# Patient Record
Sex: Female | Born: 1990 | Hispanic: Yes | Marital: Single | Smoking: Never smoker
Health system: Southern US, Community
[De-identification: ages and names within clinical notes are randomized; demographics above are authoritative.]

## PROBLEM LIST (undated history)

## (undated) DIAGNOSIS — Z789 Other specified health status: Secondary | ICD-10-CM

## (undated) HISTORY — DX: Other specified health status: Z78.9

## (undated) HISTORY — PX: CHOLECYSTECTOMY: SHX55

---

## 2014-01-27 ENCOUNTER — Ambulatory Visit (INDEPENDENT_AMBULATORY_CARE_PROVIDER_SITE_OTHER): Payer: 59 | Admitting: Emergency Medicine

## 2014-01-27 VITALS — BP 122/82 | HR 87 | Temp 98.0°F | Resp 17 | Ht 61.5 in | Wt 114.0 lb

## 2014-01-27 DIAGNOSIS — J209 Acute bronchitis, unspecified: Secondary | ICD-10-CM

## 2014-01-27 DIAGNOSIS — J0140 Acute pansinusitis, unspecified: Secondary | ICD-10-CM

## 2014-01-27 MED ORDER — HYDROCOD POLST-CHLORPHEN POLST 10-8 MG/5ML PO LQCR: 5.0000 mL | Freq: Two times a day (BID) | ORAL | Status: DC | PRN

## 2014-01-27 MED ORDER — AMOXICILLIN-POT CLAVULANATE 875-125 MG PO TABS: 1.0000 | ORAL_TABLET | Freq: Two times a day (BID) | ORAL | Status: DC

## 2014-01-27 MED ORDER — PSEUDOEPHEDRINE-GUAIFENESIN ER 60-600 MG PO TB12: 1.0000 | ORAL_TABLET | Freq: Two times a day (BID) | ORAL | Status: DC

## 2014-01-27 NOTE — Patient Instructions (Signed)
Bronquitis aguda °(Acute Bronchitis) °La bronquitis es una inflamación de las vías respiratorias que se extienden desde la tráquea hasta los pulmones (bronquios). La inflamación produce la formación de mucosidad. Esto produce tos, que es el síntoma más frecuente de la bronquitis.  °Cuando la bronquitis es aguda, generalmente comienza de manera súbita y desaparece luego de un par de semanas. El hábito de fumar, las alergias y el asma pueden empeorar la bronquitis. Los episodios repetidos de bronquitis pueden causar más problemas pulmonares.  °CAUSAS °La causa más frecuente de bronquitis aguda es el mismo virus que produce el resfrío. El virus puede propagarse de una persona a la otra (contagioso) a través de la tos y los estornudos, y al tocar objetos contaminados. °SIGNOS Y SÍNTOMAS  °· Tos. °· Fiebre. °· Tos con mucosidad. °· Dolores en el cuerpo. °· Congestión en el pecho. °· Escalofríos. °· Falta de aire. °· Dolor de garganta. °DIAGNÓSTICO  °La bronquitis aguda en general se diagnostica con un examen físico. El médico también le hará preguntas sobre su historia clínica. En algunos casos se indican otros estudios, como radiografías, para descartar otras enfermedades.  °TRATAMIENTO  °La bronquitis aguda generalmente desaparece en un par de semanas. Con frecuencia, no es necesario realizar un tratamiento. Los medicamentos se indican para aliviar la fiebre o la tos. Generalmente, no es necesario el uso de antibióticos, pero pueden recetarse en ciertas ocasiones. En algunos casos, se recomienda el uso de un inhalador para mejorar la falta de aire y controlar la tos. Un vaporizador de aire frío podrá ayudarlo a disolver las secreciones bronquiales y facilitar su eliminación.  °INSTRUCCIONES PARA EL CUIDADO EN EL HOGAR  °· Descanse lo suficiente. °· Beba líquidos en abundancia para mantener la orina de color claro o amarillo pálido (excepto que padezca una enfermedad que requiera la restricción de líquidos). El aumento  de líquidos puede ayudar a que las secreciones respiratorias (esputo) sean menos espesas y a reducir la congestión del pecho, y evitará la deshidratación. °· Tome los medicamentos solamente como se lo haya indicado el médico. °· Si le recetaron antibióticos, asegúrese de terminarlos, incluso si comienza a sentirse mejor. °· Evite fumar o aspirar el humo de otros fumadores. La exposición al humo del cigarrillo o a irritantes químicos hará que la bronquitis empeore. Si fuma, considere el uso de goma de mascar o la aplicación de parches en la piel que contengan nicotina para aliviar los síntomas de abstinencia. Si deja de fumar, sus pulmones se curarán más rápido. °· Reduzca la probabilidad de otro episodio de bronquitis aguda lavando sus manos con frecuencia, evitando a las personas que tengan síntomas y tratando de no tocarse las manos con la boca, la nariz o los ojos. °· Concurra a todas las visitas de control como se lo haya indicado el médico. °SOLICITE ATENCIÓN MÉDICA SI: °Los síntomas no mejoran después de una semana de tratamiento.  °SOLICITE ATENCIÓN MÉDICA DE INMEDIATO SI: °· Comienza a tener fiebre o escalofríos cada vez más intensos. °· Siente dolor en el pecho. °· Le falta el aire de manera preocupante. °· La flema tiene sangre. °· Se deshidrata. °· Se desmaya o siente que va a desmayarse de forma repetida. °· Tiene vómitos que se repiten. °· Tiene un dolor de cabeza intenso. °ASEGÚRESE DE QUE:  °· Comprende estas instrucciones. °· Controlará su afección. °· Recibirá ayuda de inmediato si no mejora o si empeora. °Document Released: 02/09/2005 Document Revised: 06/26/2013 °ExitCare® Patient Information ©2015 [employer]. This information is not intended to replace   advice given to you by your health care provider. Make sure you discuss any questions you have with your health care provider. ° °

## 2014-01-27 NOTE — Progress Notes (Signed)
[facility] [address], [place] [address] [phone]  Date:  01/27/2014   Name:  Waunita SchoonerZarely Valdez-Beltran   DOB:  05/28/1990   MRN:  [id number]  PCP:  No primary care provider on file.    Chief Complaint: Cough; Sore Throat; URI; Fatigue; and Allergies   History of Present Illness:  Waunita SchoonerZarely Valdez-Beltran is a 23 y.o. very pleasant female patient who presents with the following:  Patient is ill for past two days.  Has an overall feeling of malaise.  No fever or chills Has nasal congestion and a purulent post nasal drainage Sore throat. Cough productive of purulent sputum with no wheezing or shortness of breath. No nausea or vomiting.  No rash No improvement with over the counter medications or other home remedies.  Denies other complaint or health concern today.   There are no active problems to display for this patient.   No past medical history on file.  No past surgical history on file.  History  Substance Use Topics  . Smoking status: Never Smoker   . Smokeless tobacco: Not on file  . Alcohol Use: Not on file    No family history on file.  No Known Allergies  Medication list has been reviewed and updated.  No current outpatient prescriptions on file prior to visit.   No current facility-administered medications on file prior to visit.    Review of Systems:  As per HPI, otherwise negative.    Physical Examination: Filed Vitals:   01/27/14 1028  BP: 122/82  Pulse: 87  Temp: 98 F (36.7 C)  Resp: 17   Filed Vitals:   01/27/14 1028  Height: 5' 1.5" (1.562 m)  Weight: 114 lb (51.71 kg)   Body mass index is 21.19 kg/(m^2). Ideal Body Weight: Weight in (lb) to have BMI = 25: 134.2  GEN: WDWN, NAD, Non-toxic, A & O x 3 HEENT: Atraumatic, Normocephalic. Neck supple. No masses, No LAD. Ears and Nose: No external deformity. CV: RRR, No M/G/R. No JVD. No thrill. No extra heart sounds. PULM: CTA B, no wheezes, crackles,  rhonchi. No retractions. No resp. distress. No accessory muscle use. ABD: S, NT, ND, +BS. No rebound. No HSM. EXTR: No c/c/e NEURO Normal gait.  PSYCH: Normally interactive. Conversant. Not depressed or anxious appearing.  Calm demeanor.    Assessment and Plan: Sinusitis Bronchitis augmentin mucinex  tussionex   Signed,  Phillips OdorJeffery Koen Antilla, MD

## 2014-10-04 ENCOUNTER — Ambulatory Visit (INDEPENDENT_AMBULATORY_CARE_PROVIDER_SITE_OTHER): Payer: Self-pay | Admitting: Family Medicine

## 2014-10-04 VITALS — BP 104/64 | HR 74 | Temp 98.4°F | Resp 14 | Ht 61.5 in | Wt 115.0 lb

## 2014-10-04 DIAGNOSIS — R300 Dysuria: Secondary | ICD-10-CM

## 2014-10-04 LAB — POCT URINALYSIS DIPSTICK
Blood, UA: NEGATIVE
Glucose, UA: NEGATIVE
Ketones, UA: 40
NITRITE UA: NEGATIVE
PROTEIN UA: 30
Spec Grav, UA: 1.025
UROBILINOGEN UA: 2
pH, UA: 6

## 2014-10-04 LAB — POCT UA - MICROSCOPIC ONLY
CASTS, UR, LPF, POC: NEGATIVE
Crystals, Ur, HPF, POC: NEGATIVE
Yeast, UA: NEGATIVE

## 2014-10-04 MED ORDER — NITROFURANTOIN MONOHYD MACRO 100 MG PO CAPS: 100.0000 mg | ORAL_CAPSULE | Freq: Two times a day (BID) | ORAL | Status: DC

## 2014-10-04 NOTE — Progress Notes (Signed)
Subjective:    Patient ID: Rita Rojas, female    DOB: 05/09/1990, 24 y.o.   MRN: [id number]  10/04/2014  Pain w/ Urination and Abdominal Pain   HPI This 24 y.o. female presents for evaluation of dysuria, abdominal pain.  No fever but +chills two days ago; no sweats.  Chills at work for two days.  No n/v/d/c.  Normal b.m. Yesterday.  Mild dysuria.  No hematuria.  No frequency. +urgency.  +back pain but might be due to work; works at [employer].  +sexually active; four year relationship.  Diagnosed with yeast infection with female exam; last diagnosed with yeast infection was last visit.  No vaginal discharge.  Intermittent itching in vaginal area.  No STDs.  Total partners = 3.  Dates males only.  No contraception.  LMP last month; 09-22-2014.  Taking PNV.  Last pap smear not sure.     Review of Systems  Constitutional: Positive for chills. Negative for fever and diaphoresis.  Gastrointestinal: Positive for abdominal pain. Negative for nausea, vomiting, diarrhea and constipation.  Genitourinary: Positive for dysuria and urgency. Negative for frequency, hematuria, flank pain, decreased urine volume, vaginal bleeding, vaginal discharge, enuresis, difficulty urinating, genital sores, vaginal pain, menstrual problem and pelvic pain.    History reviewed. No pertinent past medical history. History reviewed. No pertinent past surgical history. No Known Allergies No current outpatient prescriptions on file.   No current facility-administered medications for this visit.   Social History   Social History  . Marital Status: Single    Spouse Name: N/A  . Number of Children: N/A  . Years of Education: N/A   Occupational History  . Not on file.   Social History Main Topics  . Smoking status: Never Smoker   . Smokeless tobacco: Not on file  . Alcohol Use: Not on file  . Drug Use: Not on file  . Sexual Activity: Yes    Birth Control/ Protection: None   Other  Topics Concern  . Not on file   Social History Narrative   Marital status: single      Children: none      Employment: Social research officer, government      Sexual activity: yes; no contraception in 2016; desires pregnancy.  Total partners = 3.  Males only.       Objective:    BP 104/64 mmHg  Pulse 74  Temp(Src) 98.4 F (36.9 C) (Oral)  Resp 14  Ht 5' 1.5" (1.562 m)  Wt 115 lb (52.164 kg)  BMI 21.38 kg/m2  SpO2 97% Physical Exam  Constitutional: She is oriented to person, place, and time. She appears well-developed and well-nourished. No distress.  HENT:  Head: Normocephalic and atraumatic.  Eyes: Conjunctivae are normal. Pupils are equal, round, and reactive to light.  Neck: Normal range of motion. Neck supple.  Cardiovascular: Normal rate, regular rhythm and normal heart sounds.  Exam reveals no gallop and no friction rub.   No murmur heard. Pulmonary/Chest: Effort normal and breath sounds normal. She has no wheezes. She has no rales.  Abdominal: Soft. Bowel sounds are normal. She exhibits no distension and no mass. There is tenderness in the suprapubic area. There is no rebound, no guarding and no CVA tenderness.  Neurological: She is alert and oriented to person, place, and time.  Skin: She is not diaphoretic.  Psychiatric: She has a normal mood and affect. Her behavior is normal.  Nursing note and vitals reviewed.  Results for orders placed or  performed in visit on 10/04/14  POCT urinalysis dipstick  Result Value Ref Range   Color, UA amber    Clarity, UA hazy    Glucose, UA neg    Bilirubin, UA small    Ketones, UA 40    Spec Grav, UA 1.025    Blood, UA neg    pH, UA 6.0    Protein, UA 30    Urobilinogen, UA 2.0    Nitrite, UA neg    Leukocytes, UA Trace (A) Negative  POCT UA - Microscopic Only  Result Value Ref Range   WBC, Ur, HPF, POC 8-12    RBC, urine, microscopic 1-3    Bacteria, U Microscopic 1+    Mucus, UA 2+    Epithelial cells, urine per micros  8-12    Crystals, Ur, HPF, POC neg    Casts, Ur, LPF, POC neg    Yeast, UA neg        Assessment & Plan:   1. Dysuria    -New. -Send urine culture. -Treat with Macrobid bid.   -RTC fever, vomiting, worsening symptoms. -Pt declined pelvic exam today; symptoms primarily urinary; no concern regarding STDs.   No orders of the defined types were placed in this encounter.    No Follow-up on file.    Omkar Stratmann Paulita Fujita, M.D. [facility] [address] [place], [place]  [address] [phone] phone [phone] fax

## 2014-10-04 NOTE — Patient Instructions (Signed)

## 2014-10-06 LAB — URINE CULTURE

## 2017-02-22 DIAGNOSIS — O021 Missed abortion: Secondary | ICD-10-CM | POA: Insufficient documentation

## 2017-06-03 ENCOUNTER — Encounter: Payer: Self-pay | Admitting: Family Medicine

## 2017-06-03 ENCOUNTER — Ambulatory Visit (INDEPENDENT_AMBULATORY_CARE_PROVIDER_SITE_OTHER): Payer: 59 | Admitting: Family Medicine

## 2017-06-03 DIAGNOSIS — Z3400 Encounter for supervision of normal first pregnancy, unspecified trimester: Secondary | ICD-10-CM

## 2017-06-03 DIAGNOSIS — Z113 Encounter for screening for infections with a predominantly sexual mode of transmission: Secondary | ICD-10-CM

## 2017-06-03 DIAGNOSIS — Z3687 Encounter for antenatal screening for uncertain dates: Secondary | ICD-10-CM

## 2017-06-03 DIAGNOSIS — Z3481 Encounter for supervision of other normal pregnancy, first trimester: Secondary | ICD-10-CM | POA: Diagnosis not present

## 2017-06-03 DIAGNOSIS — N915 Oligomenorrhea, unspecified: Secondary | ICD-10-CM

## 2017-06-03 MED ORDER — DOXYLAMINE-PYRIDOXINE 10-10 MG PO TBEC: 2.0000 | DELAYED_RELEASE_TABLET | Freq: Every day | ORAL | 5 refills | Status: DC

## 2017-06-03 MED ORDER — PRENATAL VITAMINS 0.8 MG PO TABS: 1.0000 | ORAL_TABLET | Freq: Every day | ORAL | 12 refills | Status: AC

## 2017-06-03 NOTE — Progress Notes (Signed)
DATING AND VIABILITY SONOGRAM   Rita Rojas is a 27 y.o. year old 23P0020 with LMP No LMP recorded (lmp unknown). Patient is pregnant. which would correlate to  612w5d weeks gestation.  She has regular menstrual cycles.   She is here today for a confirmatory initial sonogram.    GESTATION: SINGLETON     FETAL ACTIVITY:          Heart rate       175          The fetus is active.    ADNEXA: The ovaries are normal.   GESTATIONAL AGE AND  BIOMETRICS:  Gestational criteria: Estimated Date of Delivery: 01/01/18 by early ultrasound now at 292w5d  Previous Scans:1  GESTATIONAL SAC           4.27 cm       9-5 weeks  CROWN RUMP LENGTH           2.40 cm         9-4 weeks                                                                               AVERAGE EGA(BY THIS SCAN): 9-5weeks  WORKING EDD( early ultrasound ):  01-01-18    Armandina StammerJennifer Luree Palla 06/03/2017 9:49 AM

## 2017-06-03 NOTE — Progress Notes (Signed)
  Subjective:  Rita SchoonerZarely Valdez-Beltran is a G3P0020 7375w5d being seen today for her first obstetrical visit.  Her obstetrical history is significant for 2 prior SAB. Last SAB 01/2017. Has infreqent menses - has gone close to a year without menses. Was evaluated for this - progesterone challenge without bleeding afterwards. Patient does intend to breast feed. Pregnancy history fully reviewed.  Patient reports nausea.  BP 105/62   Pulse 67   Wt 130 lb (59 kg)   LMP  (LMP Unknown)   BMI 24.17 kg/m   HISTORY: OB History  Gravida Para Term Preterm AB Living  3       2    SAB TAB Ectopic Multiple Live Births  2            # Outcome Date GA Lbr Len/2nd Weight Sex Delivery Anes PTL Lv  3 Current           2 SAB           1 SAB             History reviewed. No pertinent past medical history.  Past Surgical History:  Procedure Laterality Date  . CHOLECYSTECTOMY      Family History  Problem Relation Age of Onset  . Diabetes Father      Exam    Uterus:     Pelvic Exam:    Perineum: No Hemorrhoids, Normal Perineum   Vulva: Bartholin's, Urethra, Skene's normal   Vagina:  normal mucosa   Cervix: no cervical motion tenderness, no lesions and nulliparous appearance   Adnexa: normal adnexa and no mass, fullness, tenderness   Bony Pelvis: gynecoid  System: Breast:  normal appearance, no masses or tenderness, Inspection negative, No nipple retraction or dimpling, No nipple discharge or bleeding, No axillary or supraclavicular adenopathy, Normal to palpation without dominant masses   Skin: normal coloration and turgor, no rashes    Neurologic: gait normal; reflexes normal and symmetric   Extremities: normal strength, tone, and muscle mass   HEENT PERRLA and extra ocular movement intact   Mouth/Teeth mucous membranes moist, pharynx normal without lesions   Neck supple and no masses   Cardiovascular: regular rate and rhythm, no murmurs or gallops   Respiratory:  appears well, vitals  normal, no respiratory distress, acyanotic, normal RR, ear and throat exam is normal, neck free of mass or lymphadenopathy, chest clear, no wheezing, crepitations, rhonchi, normal symmetric air entry   Abdomen: soft, non-tender; bowel sounds normal; no masses,  no organomegaly   Urinary: urethral meatus normal      Assessment:    Pregnancy: [id number] Patient Active Problem List   Diagnosis Date Noted  . Supervision of normal first pregnancy, antepartum 06/03/2017      Plan:   1. Supervision of normal first pregnancy, antepartum Diclegis for N/V Genetic Screening discussed Quad Screen: requested.  Ultrasound discussed; fetal survey: requested.  Follow up in 4 weeks.  - Obstetric Panel, Including HIV - Urine Culture - GC[facility]Chlamydia probe amp ([place] - Inherit[facility] Society Guided     Problem list reviewed and updated. 75% of 45 min visit spent on counseling and coordination of care.    Levie HeritageJacob J Itzy Adler 06/03/2017

## 2017-06-03 NOTE — Progress Notes (Signed)
Pt states last pap smear was a year ago-normal results.

## 2017-06-05 LAB — URINE CULTURE

## 2017-06-07 LAB — GC/CHLAMYDIA PROBE AMP (~~LOC~~) NOT AT ARMC
Chlamydia: NEGATIVE
Neisseria Gonorrhea: NEGATIVE

## 2017-06-14 LAB — OBSTETRIC PANEL, INCLUDING HIV
Antibody Screen: NEGATIVE
BASOS ABS: 0 10*3/uL (ref 0.0–0.2)
Basos: 0 %
EOS (ABSOLUTE): 0 10*3/uL (ref 0.0–0.4)
Eos: 0 %
HEP B S AG: NEGATIVE
HIV SCREEN 4TH GENERATION: NONREACTIVE
Hematocrit: 36.9 % (ref 34.0–46.6)
Hemoglobin: 12.7 g/dL (ref 11.1–15.9)
IMMATURE GRANS (ABS): 0 10*3/uL (ref 0.0–0.1)
IMMATURE GRANULOCYTES: 0 %
LYMPHS: 27 %
Lymphocytes Absolute: 2 10*3/uL (ref 0.7–3.1)
MCH: 29.6 pg (ref 26.6–33.0)
MCHC: 34.4 g/dL (ref 31.5–35.7)
MCV: 86 fL (ref 79–97)
MONOCYTES: 5 %
Monocytes Absolute: 0.4 10*3/uL (ref 0.1–0.9)
NEUTROS PCT: 68 %
Neutrophils Absolute: 5 10*3/uL (ref 1.4–7.0)
Platelets: 246 10*3/uL (ref 150–379)
RBC: 4.29 x10E6/uL (ref 3.77–5.28)
RDW: 13.2 % (ref 12.3–15.4)
RPR: NONREACTIVE
Rh Factor: POSITIVE
Rubella Antibodies, IGG: 2.81 index (ref >0.99)
WBC: 7.4 10*3/uL (ref 3.4–10.8)

## 2017-06-14 LAB — INHERITEST SOCIETY GUIDED

## 2017-07-05 ENCOUNTER — Encounter: Payer: Self-pay | Admitting: Obstetrics & Gynecology

## 2017-07-05 ENCOUNTER — Ambulatory Visit (INDEPENDENT_AMBULATORY_CARE_PROVIDER_SITE_OTHER): Payer: 59 | Admitting: Obstetrics & Gynecology

## 2017-07-05 VITALS — BP 121/64 | HR 81 | Wt 127.8 lb

## 2017-07-05 DIAGNOSIS — Z3400 Encounter for supervision of normal first pregnancy, unspecified trimester: Secondary | ICD-10-CM

## 2017-07-05 DIAGNOSIS — Z029 Encounter for administrative examinations, unspecified: Secondary | ICD-10-CM

## 2017-07-05 NOTE — Addendum Note (Signed)
Addended by: Anell Barr on: 07/05/2017 09:06 AM   Modules accepted: Orders

## 2017-07-05 NOTE — Patient Instructions (Signed)
Round Ligament Pain The round ligament is a cord of muscle and tissue that helps to support the uterus. It can become a source of pain during pregnancy if it becomes stretched or twisted as the baby grows. The pain usually begins in the second trimester of pregnancy, and it can come and go until the baby is delivered. It is not a serious problem, and it does not cause harm to the baby. Round ligament pain is usually a short, sharp, and pinching pain, but it can also be a dull, lingering, and aching pain. The pain is felt in the lower side of the abdomen or in the groin. It usually starts deep in the groin and moves up to the outside of the hip area. Pain can occur with:  A sudden change in position.  Rolling over in bed.  Coughing or sneezing.  Physical activity.  Follow these instructions at home: Watch your condition for any changes. Take these steps to help with your pain:  When the pain starts, relax. Then try: ? Sitting down. ? Flexing your knees up to your abdomen. ? Lying on your side with one pillow under your abdomen and another pillow between your legs. ? Sitting in a warm bath for 15-20 minutes or until the pain goes away.  Take over-the-counter and prescription medicines only as told by your health care provider.  Move slowly when you sit and stand.  Avoid long walks if they cause pain.  Stop or lessen your physical activities if they cause pain.  Contact a health care provider if:  Your pain does not go away with treatment.  You feel pain in your back that you did not have before.  Your medicine is not helping. Get help right away if:  You develop a fever or chills.  You develop uterine contractions.  You develop vaginal bleeding.  You develop nausea or vomiting.  You develop diarrhea.  You have pain when you urinate. This information is not intended to replace advice given to you by your health care provider. Make sure you discuss any questions you have  with your health care provider. Document Released: 11/19/2007 Document Revised: 07/18/2015 Document Reviewed: 04/18/2014 Elsevier Interactive Patient Education  2018 [place] for Pregnant Women While you are pregnant, your body will require additional nutrition to help support your growing baby. It is recommended that you consume:  150 additional calories each day during your first trimester.  300 additional calories each day during your second trimester.  300 additional calories each day during your third trimester.  Eating a healthy, well-balanced diet is very important for your health and for your baby's health. You also have a higher need for some vitamins and minerals, such as folic acid, calcium, iron, and vitamin D. What do I need to know about eating during pregnancy?  Do not try to lose weight or go on a diet during pregnancy.  Choose healthy, nutritious foods. Choose  of a sandwich with a glass of milk instead of a candy bar or a high-calorie sugar-sweetened beverage.  Limit your overall intake of foods that have "empty calories." These are foods that have little nutritional value, such as sweets, desserts, candies, sugar-sweetened beverages, and fried foods.  Eat a variety of foods, especially fruits and vegetables.  Take a prenatal vitamin to help meet the additional needs during pregnancy, specifically for folic acid, iron, calcium, and vitamin D.  Remember to stay active. Ask your health care provider for exercise recommendations  that are specific to you.  Practice good food safety and cleanliness, such as washing your hands before you eat and after you prepare raw meat. This helps to prevent foodborne illnesses, such as listeriosis, that can be very dangerous for your baby. Ask your health care provider for more information about listeriosis. What does 150 extra calories look like? Healthy options for an additional 150 calories each day could be any of  the following:  Plain low-fat yogurt (6-8 oz) with  cup of berries.  1 apple with 2 teaspoons of peanut butter.  Cut-up vegetables with  cup of hummus.  Low-fat chocolate milk (8 oz or 1 cup).  1 string cheese with 1 medium orange.   of a peanut butter and jelly sandwich on whole-wheat bread (1 tsp of peanut butter).  For 300 calories, you could eat two of those healthy options each day. What is a healthy amount of weight to gain? The recommended amount of weight for you to gain is based on your pre-pregnancy BMI. If your pre-pregnancy BMI was:  Less than 18 (underweight), you should gain 28-40 lb.  18-24.9 (normal), you should gain 25-35 lb.  25-29.9 (overweight), you should gain 15-25 lb.  Greater than 30 (obese), you should gain 11-20 lb.  What if I am having twins or multiples? Generally, pregnant women who will be having twins or multiples may need to increase their daily calories by 300-600 calories each day. The recommended range for total weight gain is 25-54 lb, depending on your pre-pregnancy BMI. Talk with your health care provider for specific guidance about additional nutritional needs, weight gain, and exercise during your pregnancy. What foods can I eat? Grains Any grains. Try to choose whole grains, such as whole-wheat bread, oatmeal, or brown rice. Vegetables Any vegetables. Try to eat a variety of colors and types of vegetables to get a full range of vitamins and minerals. Remember to wash your vegetables well before eating. Fruits Any fruits. Try to eat a variety of colors and types of fruit to get a full range of vitamins and minerals. Remember to wash your fruits well before eating. Meats and Other Protein Sources Lean meats, including chicken, [place], fish, and lean cuts of beef, veal, or pork. Make sure that all meats are cooked to "well done." Tofu. Tempeh. Beans. Eggs. Peanut butter and other nut butters. Seafood, such as shrimp, crab, and lobster. If  you choose fish, select types that are higher in omega-3 fatty acids, including salmon, herring, mussels, trout, sardines, and pollock. Make sure that all meats are cooked to food-safe temperatures. Dairy Pasteurized milk and milk alternatives. Pasteurized yogurt and pasteurized cheese. Cottage cheese. Sour cream. Beverages Water. Juices that contain 100% fruit juice or vegetable juice. Caffeine-free teas and decaffeinated coffee. Drinks that contain caffeine are okay to drink, but it is better to avoid caffeine. Keep your total caffeine intake to less than 200 mg each day (12 oz of coffee, tea, or soda) or as directed by your health care provider. Condiments Any pasteurized condiments. Sweets and Desserts Any sweets and desserts. Fats and Oils Any fats and oils. The items listed above may not be a complete list of recommended foods or beverages. Contact your dietitian for more options. What foods are not recommended? Vegetables Unpasteurized (raw) vegetable juices. Fruits Unpasteurized (raw) fruit juices. Meats and Other Protein Sources Cured meats that have nitrates, such as bacon, salami, and hotdogs. Luncheon meats, bologna, or other deli meats (unless they are reheated until they  are steaming hot). Refrigerated pate, meat spreads from a meat counter, smoked seafood that is found in the refrigerated section of a store. Raw fish, such as sushi or sashimi. High mercury content fish, such as tilefish, shark, swordfish, and king mackerel. Raw meats, such as tuna or beef tartare. Undercooked meats and poultry. Make sure that all meats are cooked to food-safe temperatures. Dairy Unpasteurized (raw) milk and any foods that have raw milk in them. Soft cheeses, such as feta, queso blanco, queso fresco, Brie, Camembert cheeses, blue-veined cheeses, and Panela cheese (unless it is made with pasteurized milk, which must be stated on the label). Beverages Alcohol. Sugar-sweetened beverages, such as  sodas, teas, or energy drinks. Condiments Homemade fermented foods and drinks, such as pickles, sauerkraut, or kombucha drinks. (Store-bought pasteurized versions of these are okay.) Other Salads that are made in the store, such as ham salad, chicken salad, egg salad, tuna salad, and seafood salad. The items listed above may not be a complete list of foods and beverages to avoid. Contact your dietitian for more information. This information is not intended to replace advice given to you by your health care provider. Make sure you discuss any questions you have with your health care provider. Document Released: 11/24/2013 Document Revised: 07/18/2015 Document Reviewed: 07/25/2013 Elsevier Interactive Patient Education  [employer].

## 2017-07-05 NOTE — Progress Notes (Signed)
   PRENATAL VISIT NOTE  Subjective:  Rita Rojas is a 27 y.o. G3P0020 at [redacted]w[redacted]d being seen today for ongoing prenatal care.  She is currently monitored for the following issues for this low-risk pregnancy and has Supervision of normal first pregnancy, antepartum and Oligomenorrhea on their problem list.  Patient reports occo lower pelvic pain wtih prolonged standing..  Contractions: Not present. Vag. Bleeding: None.  Movement: Absent. Denies leaking of fluid.   The following portions of the patient's history were reviewed and updated as appropriate: allergies, current medications, past family history, past medical history, past social history, past surgical history and problem list. Problem list updated.  Objective:   Vitals:   07/05/17 0825  BP: 121/64  Pulse: 81  Weight: 127 lb 12.8 oz (58 kg)    Fetal Status: Fetal Heart Rate (bpm): 156   Movement: Absent     General:  Alert, oriented and cooperative. Patient is in no acute distress.  Skin: Skin is warm and dry. No rash noted.   Cardiovascular: Normal heart rate noted  Respiratory: Normal respiratory effort, no problems with respiration noted  Abdomen: Soft, gravid, appropriate for gestational age.  Pain/Pressure: Present     Pelvic: Cervical exam deferred        Extremities: Normal range of motion.  Edema: Trace  Mental Status: Normal mood and affect. Normal behavior. Normal judgment and thought content.   Assessment and Plan:  Pregnancy: G3P0020 at [redacted]w[redacted]d  1. Supervision of normal first pregnancy, antepartum Anatomy scan ordered.  2. Round ligament pain   Preterm labor symptoms and general obstetric precautions including but not limited to vaginal bleeding, contractions, leaking of fluid and fetal movement were reviewed in detail with the patient. Please refer to After Visit Summary for other counseling recommendations.  Return in about 1 month (around 08/02/2017).  No future appointments.  Willodean Rosenthal, MD

## 2017-07-08 ENCOUNTER — Encounter: Payer: Self-pay | Admitting: *Deleted

## 2017-07-22 ENCOUNTER — Telehealth: Payer: Self-pay

## 2017-07-22 NOTE — Telephone Encounter (Signed)
Pt called the office stating that she started cramping on her side after intercourse. Per Dr. Adrian Blackwater, a little cramping is normal but pt should come in if cramping starts to feel like contractions. Pt voiced understanding.

## 2017-08-02 ENCOUNTER — Encounter: Payer: Self-pay | Admitting: Obstetrics & Gynecology

## 2017-08-02 ENCOUNTER — Ambulatory Visit (INDEPENDENT_AMBULATORY_CARE_PROVIDER_SITE_OTHER): Payer: 59 | Admitting: Obstetrics & Gynecology

## 2017-08-02 ENCOUNTER — Ambulatory Visit (HOSPITAL_COMMUNITY)
Admission: RE | Admit: 2017-08-02 | Discharge: 2017-08-02 | Disposition: A | Discharge status: Home / Self Care | Payer: 59 | Source: Ambulatory Visit | Attending: Obstetrics & Gynecology | Admitting: Obstetrics & Gynecology

## 2017-08-02 ENCOUNTER — Encounter (HOSPITAL_COMMUNITY): Payer: Self-pay

## 2017-08-02 VITALS — BP 110/65 | HR 72 | Wt 129.0 lb

## 2017-08-02 DIAGNOSIS — O4402 Placenta previa specified as without hemorrhage, second trimester: Secondary | ICD-10-CM | POA: Insufficient documentation

## 2017-08-02 DIAGNOSIS — Z3400 Encounter for supervision of normal first pregnancy, unspecified trimester: Secondary | ICD-10-CM

## 2017-08-02 DIAGNOSIS — O4690 Antepartum hemorrhage, unspecified, unspecified trimester: Secondary | ICD-10-CM | POA: Insufficient documentation

## 2017-08-02 DIAGNOSIS — Z3689 Encounter for other specified antenatal screening: Secondary | ICD-10-CM | POA: Insufficient documentation

## 2017-08-02 DIAGNOSIS — O4692 Antepartum hemorrhage, unspecified, second trimester: Secondary | ICD-10-CM | POA: Insufficient documentation

## 2017-08-02 DIAGNOSIS — Z3A18 18 weeks gestation of pregnancy: Secondary | ICD-10-CM | POA: Diagnosis not present

## 2017-08-02 DIAGNOSIS — Z113 Encounter for screening for infections with a predominantly sexual mode of transmission: Secondary | ICD-10-CM

## 2017-08-02 NOTE — Addendum Note (Signed)
Addended by: Lorelle GibbsWILSON, Sidonie Dexheimer L on: 08/02/2017 10:57 AM   Modules accepted: Orders

## 2017-08-02 NOTE — Patient Instructions (Signed)
Second Trimester of Pregnancy The second trimester is from week 13 through week 28, month 4 through 6. This is often the time in pregnancy that you feel your best. Often times, morning sickness has lessened or quit. You may have more energy, and you may get hungry more often. Your unborn baby (fetus) is growing rapidly. At the end of the sixth month, he or she is about 9 inches long and weighs about 1 pounds. You will likely feel the baby move (quickening) between 18 and 20 weeks of pregnancy. Follow these instructions at home:  Avoid all smoking, herbs, and alcohol. Avoid drugs not approved by your doctor.  Do not use any tobacco products, including cigarettes, chewing tobacco, and electronic cigarettes. If you need help quitting, ask your doctor. You may get counseling or other support to help you quit.  Only take medicine as told by your doctor. Some medicines are safe and some are not during pregnancy.  Exercise only as told by your doctor. Stop exercising if you start having cramps.  Eat regular, healthy meals.  Wear a good support bra if your breasts are tender.  Do not use hot tubs, steam rooms, or saunas.  Wear your seat belt when driving.  Avoid raw meat, uncooked cheese, and liter boxes and soil used by cats.  Take your prenatal vitamins.  Take 1500-2000 milligrams of calcium daily starting at the 20th week of pregnancy until you deliver your baby.  Try taking medicine that helps you poop (stool softener) as needed, and if your doctor approves. Eat more fiber by eating fresh fruit, vegetables, and whole grains. Drink enough fluids to keep your pee (urine) clear or pale yellow.  Take warm water baths (sitz baths) to soothe pain or discomfort caused by hemorrhoids. Use hemorrhoid cream if your doctor approves.  If you have puffy, bulging veins (varicose veins), wear support hose. Raise (elevate) your feet for 15 minutes, 3-4 times a day. Limit salt in your diet.  Avoid heavy  lifting, wear low heals, and sit up straight.  Rest with your legs raised if you have leg cramps or low back pain.  Visit your dentist if you have not gone during your pregnancy. Use a soft toothbrush to brush your teeth. Be gentle when you floss.  You can have sex (intercourse) unless your doctor tells you not to.  Go to your doctor visits. Get help if:  You feel dizzy.  You have mild cramps or pressure in your lower belly (abdomen).  You have a nagging pain in your belly area.  You continue to feel sick to your stomach (nauseous), throw up (vomit), or have watery poop (diarrhea).  You have bad smelling fluid coming from your vagina.  You have pain with peeing (urination). Get help right away if:  You have a fever.  You are leaking fluid from your vagina.  You have spotting or bleeding from your vagina.  You have severe belly cramping or pain.  You lose or gain weight rapidly.  You have trouble catching your breath and have chest pain.  You notice sudden or extreme puffiness (swelling) of your face, hands, ankles, feet, or legs.  You have not felt the baby move in over an hour.  You have severe headaches that do not go away with medicine.  You have vision changes. This information is not intended to replace advice given to you by your health care provider. Make sure you discuss any questions you have with your health care   provider. Document Released: 05/06/2009 Document Revised: 07/18/2015 Document Reviewed: 04/12/2012 Elsevier Interactive Patient Education  2017 Elsevier Inc.  

## 2017-08-02 NOTE — Progress Notes (Signed)
Patient had some spotting this morning. Armandina StammerJennifer Howard RN

## 2017-08-02 NOTE — Progress Notes (Signed)
   PRENATAL VISIT NOTE  Subjective:  Rita Rojas is a 27 y.o. G3P0020 at 6934w2d being seen today for ongoing prenatal care.  She is currently monitored for the following issues for this high-risk pregnancy and has Supervision of normal first pregnancy, antepartum; Oligomenorrhea; and Antepartum bleeding, second trimester on their problem list.  Patient reports bleeding. She noticed clot on her pad at 0630 today. No active bleeding.    Contractions: Not present. Vag. Bleeding: Scant.  Movement: Present. Denies leaking of fluid.   The following portions of the patient's history were reviewed and updated as appropriate: allergies, current medications, past family history, past medical history, past social history, past surgical history and problem list. Problem list updated.  Objective:   Vitals:   08/02/17 0958  BP: 110/65  Pulse: 72  Weight: 129 lb (58.5 kg)    Fetal Status: Fetal Heart Rate (bpm): 147   Movement: Present     General:  Alert, oriented and cooperative. Patient is in no acute distress.  Skin: Skin is warm and dry. No rash noted.   Cardiovascular: Normal heart rate noted  Respiratory: Normal respiratory effort, no problems with respiration noted  Abdomen: Soft, gravid, appropriate for gestational age.  Pain/Pressure: Present     Pelvic: Cervical exam performed      mod sized clot noted in vault  Extremities: Normal range of motion.  Edema: Trace  Mental Status: Normal mood and affect. Normal behavior. Normal judgment and thought content.   Assessment and Plan:  Pregnancy: G3P0020 at 5134w2d  1. Supervision of normal first pregnancy, antepartum Has anatomy scan scheduled for 6/18 Panorama for genetic testing today  2. Antepartum bleeding, second trimester The cervix is fingertip. Rec CL to eval for cervical insufficiency.    Preterm labor symptoms and general obstetric precautions including but not limited to vaginal bleeding, contractions, leaking of fluid  and fetal movement were reviewed in detail with the patient. Please refer to After Visit Summary for other counseling recommendations.  No follow-ups on file.  Future Appointments  Date Time Provider Department Center  08/09/2017  8:15 AM [facility] US 4 [facility]    Willodean Rosenthalarolyn Harraway-Smith, MD

## 2017-08-03 ENCOUNTER — Other Ambulatory Visit: Payer: Self-pay

## 2017-08-03 ENCOUNTER — Telehealth: Payer: Self-pay

## 2017-08-03 DIAGNOSIS — Z3400 Encounter for supervision of normal first pregnancy, unspecified trimester: Secondary | ICD-10-CM

## 2017-08-03 LAB — CERVICOVAGINAL ANCILLARY ONLY
BACTERIAL VAGINITIS: NEGATIVE
Candida vaginitis: NEGATIVE
Chlamydia: NEGATIVE
Neisseria Gonorrhea: NEGATIVE
Trichomonas: NEGATIVE

## 2017-08-03 NOTE — Telephone Encounter (Signed)
Pt scheduled for f/u US on 08/30/17 at 9:30 am. Pt notified. Understanding was voiced.

## 2017-08-09 ENCOUNTER — Ambulatory Visit (HOSPITAL_COMMUNITY): Payer: 59

## 2017-08-18 ENCOUNTER — Other Ambulatory Visit: Payer: Self-pay

## 2017-08-18 DIAGNOSIS — O4692 Antepartum hemorrhage, unspecified, second trimester: Secondary | ICD-10-CM

## 2017-08-18 DIAGNOSIS — O4690 Antepartum hemorrhage, unspecified, unspecified trimester: Secondary | ICD-10-CM

## 2017-08-30 ENCOUNTER — Ambulatory Visit (HOSPITAL_COMMUNITY)
Admission: RE | Admit: 2017-08-30 | Discharge: 2017-08-30 | Disposition: A | Discharge status: Home / Self Care | Payer: 59 | Source: Ambulatory Visit | Attending: Obstetrics & Gynecology | Admitting: Obstetrics & Gynecology

## 2017-08-30 ENCOUNTER — Other Ambulatory Visit (HOSPITAL_COMMUNITY): Payer: Self-pay | Admitting: *Deleted

## 2017-08-30 DIAGNOSIS — O4442 Low lying placenta NOS or without hemorrhage, second trimester: Secondary | ICD-10-CM | POA: Diagnosis not present

## 2017-08-30 DIAGNOSIS — O4440 Low lying placenta NOS or without hemorrhage, unspecified trimester: Secondary | ICD-10-CM

## 2017-08-30 DIAGNOSIS — Z3A22 22 weeks gestation of pregnancy: Secondary | ICD-10-CM | POA: Insufficient documentation

## 2017-08-30 DIAGNOSIS — Z3400 Encounter for supervision of normal first pregnancy, unspecified trimester: Secondary | ICD-10-CM

## 2017-08-30 DIAGNOSIS — O321XX0 Maternal care for breech presentation, not applicable or unspecified: Secondary | ICD-10-CM | POA: Insufficient documentation

## 2017-08-30 DIAGNOSIS — O4402 Placenta previa specified as without hemorrhage, second trimester: Secondary | ICD-10-CM

## 2017-08-30 DIAGNOSIS — Z362 Encounter for other antenatal screening follow-up: Secondary | ICD-10-CM | POA: Insufficient documentation

## 2017-09-02 ENCOUNTER — Encounter: Payer: Self-pay | Admitting: Family Medicine

## 2017-09-02 ENCOUNTER — Ambulatory Visit (INDEPENDENT_AMBULATORY_CARE_PROVIDER_SITE_OTHER): Payer: 59 | Admitting: Family Medicine

## 2017-09-02 VITALS — BP 105/71 | HR 67 | Wt 134.0 lb

## 2017-09-02 DIAGNOSIS — Z3400 Encounter for supervision of normal first pregnancy, unspecified trimester: Secondary | ICD-10-CM

## 2017-09-02 NOTE — Progress Notes (Signed)
   PRENATAL VISIT NOTE  Subjective:  Rita Rojas is a 27 y.o. G3P0020 at 4588w5d being seen today for ongoing prenatal care.  She is currently monitored for the following issues for this low-risk pregnancy and has Supervision of normal first pregnancy, antepartum; Oligomenorrhea; Antepartum bleeding, second trimester; Vaginal bleeding in pregnancy; Encounter for fetal anatomic survey; and Placenta previa in second trimester on their problem list.  Patient reports no complaints.  Contractions: Not present. Vag. Bleeding: None.  Movement: Present. Denies leaking of fluid.   The following portions of the patient's history were reviewed and updated as appropriate: allergies, current medications, past family history, past medical history, past social history, past surgical history and problem list. Problem list updated.  Objective:   Vitals:   09/02/17 0811  BP: 105/71  Pulse: 67  Weight: 134 lb (60.8 kg)    Fetal Status: Fetal Heart Rate (bpm): 145   Movement: Present     General:  Alert, oriented and cooperative. Patient is in no acute distress.  Skin: Skin is warm and dry. No rash noted.   Cardiovascular: Normal heart rate noted  Respiratory: Normal respiratory effort, no problems with respiration noted  Abdomen: Soft, gravid, appropriate for gestational age.  Pain/Pressure: Absent     Pelvic: Cervical exam deferred        Extremities: Normal range of motion.  Edema: None  Mental Status: Normal mood and affect. Normal behavior. Normal judgment and thought content.   Assessment and Plan:  Pregnancy: G3P0020 at 3788w5d  1. Supervision of normal first pregnancy, antepartum FHT and FH normal. Weight gain normal. Placenta a little low (1.9cm from cervix). Rpt US already scheduled.  Preterm labor symptoms and general obstetric precautions including but not limited to vaginal bleeding, contractions, leaking of fluid and fetal movement were reviewed in detail with the patient. Please  refer to After Visit Summary for other counseling recommendations.  Return in about 1 month (around 09/30/2017) for OB f/u, 2 hr GTT.  Future Appointments  Date Time Provider Department Center  10/26/2017  8:15 AM [facility] US 4 [facility]    Levie HeritageJacob J Alta Shober, DO

## 2017-10-04 ENCOUNTER — Encounter: Payer: Self-pay | Admitting: Obstetrics & Gynecology

## 2017-10-04 ENCOUNTER — Ambulatory Visit (INDEPENDENT_AMBULATORY_CARE_PROVIDER_SITE_OTHER): Payer: 59 | Admitting: Obstetrics & Gynecology

## 2017-10-04 VITALS — BP 109/70 | HR 63 | Wt 138.0 lb

## 2017-10-04 DIAGNOSIS — Z3400 Encounter for supervision of normal first pregnancy, unspecified trimester: Secondary | ICD-10-CM

## 2017-10-04 DIAGNOSIS — Z3482 Encounter for supervision of other normal pregnancy, second trimester: Secondary | ICD-10-CM

## 2017-10-04 DIAGNOSIS — Z3490 Encounter for supervision of normal pregnancy, unspecified, unspecified trimester: Secondary | ICD-10-CM

## 2017-10-04 DIAGNOSIS — O4402 Placenta previa specified as without hemorrhage, second trimester: Secondary | ICD-10-CM

## 2017-10-04 DIAGNOSIS — Z23 Encounter for immunization: Secondary | ICD-10-CM | POA: Diagnosis not present

## 2017-10-04 DIAGNOSIS — O4692 Antepartum hemorrhage, unspecified, second trimester: Secondary | ICD-10-CM

## 2017-10-04 NOTE — Progress Notes (Signed)
Patient sent to lab for her 2 hour gtt and 28 weeks labs today. Armandina StammerJennifer Howard RN

## 2017-10-04 NOTE — Patient Instructions (Signed)
Levonorgestrel intrauterine device (IUD) What is this medicine? LEVONORGESTREL IUD (LEE voe nor jes trel) is a contraceptive (birth control) device. The device is placed inside the uterus by a healthcare professional. It is used to prevent pregnancy. This device can also be used to treat heavy bleeding that occurs during your period. This medicine may be used for other purposes; ask your health care provider or pharmacist if you have questions. COMMON BRAND NAME(S): Kyleena, LILETTA, Mirena, Skyla What should I tell my health care provider before I take this medicine? They need to know if you have any of these conditions: -abnormal Pap smear -cancer of the breast, uterus, or cervix -diabetes -endometritis -genital or pelvic infection now or in the past -have more than one sexual partner or your partner has more than one partner -heart disease -history of an ectopic or tubal pregnancy -immune system problems -IUD in place -liver disease or tumor -problems with blood clots or take blood-thinners -seizures -use intravenous drugs -uterus of unusual shape -vaginal bleeding that has not been explained -an unusual or allergic reaction to levonorgestrel, other hormones, silicone, or polyethylene, medicines, foods, dyes, or preservatives -pregnant or trying to get pregnant -breast-feeding How should I use this medicine? This device is placed inside the uterus by a health care professional. Talk to your pediatrician regarding the use of this medicine in children. Special care may be needed. Overdosage: If you think you have taken too much of this medicine contact a poison control center or emergency room at once. NOTE: This medicine is only for you. Do not share this medicine with others. What if I miss a dose? This does not apply. Depending on the brand of device you have inserted, the device will need to be replaced every 3 to 5 years if you wish to continue using this type of birth  control. What may interact with this medicine? Do not take this medicine with any of the following medications: -amprenavir -bosentan -fosamprenavir This medicine may also interact with the following medications: -aprepitant -armodafinil -barbiturate medicines for inducing sleep or treating seizures -bexarotene -boceprevir -griseofulvin -medicines to treat seizures like carbamazepine, ethotoin, felbamate, oxcarbazepine, phenytoin, topiramate -modafinil -pioglitazone -rifabutin -rifampin -rifapentine -some medicines to treat HIV infection like atazanavir, efavirenz, indinavir, lopinavir, nelfinavir, tipranavir, ritonavir -St. John's wort -warfarin This list may not describe all possible interactions. Give your health care provider a list of all the medicines, herbs, non-prescription drugs, or dietary supplements you use. Also tell them if you smoke, drink alcohol, or use illegal drugs. Some items may interact with your medicine. What should I watch for while using this medicine? Visit your doctor or health care professional for regular check ups. See your doctor if you or your partner has sexual contact with others, becomes HIV positive, or gets a sexual transmitted disease. This product does not protect you against HIV infection (AIDS) or other sexually transmitted diseases. You can check the placement of the IUD yourself by reaching up to the top of your vagina with clean fingers to feel the threads. Do not pull on the threads. It is a good habit to check placement after each menstrual period. Call your doctor right away if you feel more of the IUD than just the threads or if you cannot feel the threads at all. The IUD may come out by itself. You may become pregnant if the device comes out. If you notice that the IUD has come out use a backup birth control method like condoms and call your   health care provider. Using tampons will not change the position of the IUD and are okay to use  during your period. This IUD can be safely scanned with magnetic resonance imaging (MRI) only under specific conditions. Before you have an MRI, tell your healthcare provider that you have an IUD in place, and which type of IUD you have in place. What side effects may I notice from receiving this medicine? Side effects that you should report to your doctor or health care professional as soon as possible: -allergic reactions like skin rash, itching or hives, swelling of the face, lips, or tongue -fever, flu-like symptoms -genital sores -high blood pressure -no menstrual period for 6 weeks during use -pain, swelling, warmth in the leg -pelvic pain or tenderness -severe or sudden headache -signs of pregnancy -stomach cramping -sudden shortness of breath -trouble with balance, talking, or walking -unusual vaginal bleeding, discharge -yellowing of the eyes or skin Side effects that usually do not require medical attention (report to your doctor or health care professional if they continue or are bothersome): -acne -breast pain -change in sex drive or performance -changes in weight -cramping, dizziness, or faintness while the device is being inserted -headache -irregular menstrual bleeding within first 3 to 6 months of use -nausea This list may not describe all possible side effects. Call your doctor for medical advice about side effects. You may report side effects to FDA at 1-800-FDA-1088. Where should I keep my medicine? This does not apply. NOTE: This sheet is a summary. It may not cover all possible information. If you have questions about this medicine, talk to your doctor, pharmacist, or health care provider.  2018 Elsevier/Gold Standard (2015-11-22 14:14:56) Third Trimester of Pregnancy The third trimester is from week 29 through week 42, months 7 through 9. This trimester is when your unborn baby (fetus) is growing very fast. At the end of the ninth month, the unborn baby is  about 20 inches in length. It weighs about 6-10 pounds. Follow these instructions at home:  Avoid all smoking, herbs, and alcohol. Avoid drugs not approved by your doctor.  Do not use any tobacco products, including cigarettes, chewing tobacco, and electronic cigarettes. If you need help quitting, ask your doctor. You may get counseling or other support to help you quit.  Only take medicine as told by your doctor. Some medicines are safe and some are not during pregnancy.  Exercise only as told by your doctor. Stop exercising if you start having cramps.  Eat regular, healthy meals.  Wear a good support bra if your breasts are tender.  Do not use hot tubs, steam rooms, or saunas.  Wear your seat belt when driving.  Avoid raw meat, uncooked cheese, and liter boxes and soil used by cats.  Take your prenatal vitamins.  Take 1500-2000 milligrams of calcium daily starting at the 20th week of pregnancy until you deliver your baby.  Try taking medicine that helps you poop (stool softener) as needed, and if your doctor approves. Eat more fiber by eating fresh fruit, vegetables, and whole grains. Drink enough fluids to keep your pee (urine) clear or pale yellow.  Take warm water baths (sitz baths) to soothe pain or discomfort caused by hemorrhoids. Use hemorrhoid cream if your doctor approves.  If you have puffy, bulging veins (varicose veins), wear support hose. Raise (elevate) your feet for 15 minutes, 3-4 times a day. Limit salt in your diet.  Avoid heavy lifting, wear low heels, and sit up straight.  Rest with your legs raised if you have leg cramps or low back pain.  Visit your dentist if you have not gone during your pregnancy. Use a soft toothbrush to brush your teeth. Be gentle when you floss.  You can have sex (intercourse) unless your doctor tells you not to.  Do not travel far distances unless you must. Only do so with your doctor's approval.  Take prenatal  classes.  Practice driving to the hospital.  Pack your hospital bag.  Prepare the baby's room.  Go to your doctor visits. Get help if:  You are not sure if you are in labor or if your water has broken.  You are dizzy.  You have mild cramps or pressure in your lower belly (abdominal).  You have a nagging pain in your belly area.  You continue to feel sick to your stomach (nauseous), throw up (vomit), or have watery poop (diarrhea).  You have bad smelling fluid coming from your vagina.  You have pain with peeing (urination). Get help right away if:  You have a fever.  You are leaking fluid from your vagina.  You are spotting or bleeding from your vagina.  You have severe belly cramping or pain.  You lose or gain weight rapidly.  You have trouble catching your breath and have chest pain.  You notice sudden or extreme puffiness (swelling) of your face, hands, ankles, feet, or legs.  You have not felt the baby move in over an hour.  You have severe headaches that do not go away with medicine.  You have vision changes. This information is not intended to replace advice given to you by your health care provider. Make sure you discuss any questions you have with your health care provider. Document Released: 05/06/2009 Document Revised: 07/18/2015 Document Reviewed: 04/12/2012 Elsevier Interactive Patient Education  2017 [employer]

## 2017-10-04 NOTE — Progress Notes (Signed)
   PRENATAL VISIT NOTE  Subjective:  Rita Rojas is a 27 y.o. G3P0020 at 6360w2d being seen today for ongoing prenatal care.  She is currently monitored for the following issues for this low-risk pregnancy and has Supervision of normal first pregnancy, antepartum; Antepartum bleeding, second trimester; Vaginal bleeding in pregnancy; and Placenta previa in second trimester on their problem list.  Patient reports no complaints.  Contractions: Not present. Vag. Bleeding: None.  Movement: Present. Denies leaking of fluid.   The following portions of the patient's history were reviewed and updated as appropriate: allergies, current medications, past family history, past medical history, past social history, past surgical history and problem list. Problem list updated.  Objective:   Vitals:   10/04/17 0915  BP: 109/70  Pulse: 63  Weight: 138 lb (62.6 kg)    Fetal Status:     Movement: Present     General:  Alert, oriented and cooperative. Patient is in no acute distress.  Skin: Skin is warm and dry. No rash noted.   Cardiovascular: Normal heart rate noted  Respiratory: Normal respiratory effort, no problems with respiration noted  Abdomen: Soft, gravid, appropriate for gestational age.  Pain/Pressure: Absent     Pelvic: Cervical exam deferred        Extremities: Normal range of motion.  Edema: None  Mental Status: Normal mood and affect. Normal behavior. Normal judgment and thought content.   Assessment and Plan:  Pregnancy: G3P0020 at 7760w2d  1. Prenatal care, antepartum  - Glucose Tolerance, 2 Hours w/1 Hour - RPR - HIV antibody (with reflex) - CBC  2. Supervision of normal first pregnancy, antepartum  3. Placenta previa in second trimester 08/30/2017 Placenta is low lying (1.9 cm from the internal os on  transabdominal scan). She does not have vaginal bleeding.  Low-lying placenta usually resolves with advancing gestation  4. Antepartum bleeding, second  trimester resolved  Preterm labor symptoms and general obstetric precautions including but not limited to vaginal bleeding, contractions, leaking of fluid and fetal movement were reviewed in detail with the patient. Please refer to After Visit Summary for other counseling recommendations.  Return in about 2 weeks (around 10/18/2017).  Future Appointments  Date Time Provider Department Center  10/26/2017  8:15 AM [facility] US 4 [facility]    Willodean Rosenthalarolyn Harraway-Smith, MD

## 2017-10-05 LAB — CBC
HEMATOCRIT: 35.5 % (ref 34.0–46.6)
Hemoglobin: 11.7 g/dL (ref 11.1–15.9)
MCH: 29.9 pg (ref 26.6–33.0)
MCHC: 33 g/dL (ref 31.5–35.7)
MCV: 91 fL (ref 79–97)
PLATELETS: 250 10*3/uL (ref 150–450)
RBC: 3.91 x10E6/uL (ref 3.77–5.28)
RDW: 12.5 % (ref 12.3–15.4)
WBC: 7.9 10*3/uL (ref 3.4–10.8)

## 2017-10-05 LAB — GLUCOSE TOLERANCE, 2 HOURS W/ 1HR
GLUCOSE, 1 HOUR: 135 mg/dL (ref 65–179)
GLUCOSE, 2 HOUR: 123 mg/dL (ref 65–152)
Glucose, Fasting: 66 mg/dL (ref 65–91)

## 2017-10-05 LAB — RPR: RPR Ser Ql: NONREACTIVE

## 2017-10-05 LAB — HIV ANTIBODY (ROUTINE TESTING W REFLEX): HIV SCREEN 4TH GENERATION: NONREACTIVE

## 2017-10-19 ENCOUNTER — Ambulatory Visit (INDEPENDENT_AMBULATORY_CARE_PROVIDER_SITE_OTHER): Payer: 59 | Admitting: Advanced Practice Midwife

## 2017-10-19 ENCOUNTER — Encounter: Payer: Self-pay | Admitting: Advanced Practice Midwife

## 2017-10-19 VITALS — BP 110/65 | HR 82 | Wt 141.0 lb

## 2017-10-19 DIAGNOSIS — Z3400 Encounter for supervision of normal first pregnancy, unspecified trimester: Secondary | ICD-10-CM

## 2017-10-19 DIAGNOSIS — O4440 Low lying placenta NOS or without hemorrhage, unspecified trimester: Secondary | ICD-10-CM

## 2017-10-19 NOTE — Progress Notes (Deleted)
   PRENATAL VISIT NOTE  Subjective:  Rita Rojas is a 27 y.o. G3P0020 at 5740w3d being seen today for ongoing prenatal care.  She is currently monitored for the following issues for this {Blank single:19197::"high-risk","low-risk"} pregnancy and has Supervision of normal first pregnancy, antepartum; Antepartum bleeding, second trimester; Vaginal bleeding in pregnancy; Placenta previa in second trimester; and Missed abortion on their problem list.  Patient reports {sx:14538}.  Contractions: Not present. Vag. Bleeding: None.  Movement: Present. Denies leaking of fluid.   The following portions of the patient's history were reviewed and updated as appropriate: allergies, current medications, past family history, past medical history, past social history, past surgical history and problem list. Problem list updated.  Objective:   Vitals:   10/19/17 0900  BP: 110/65  Pulse: 82  Weight: 64 kg    Fetal Status: Fetal Heart Rate (bpm): 135   Movement: Present     General:  Alert, oriented and cooperative. Patient is in no acute distress.  Skin: Skin is warm and dry. No rash noted.   Cardiovascular: Normal heart rate noted  Respiratory: Normal respiratory effort, no problems with respiration noted  Abdomen: Soft, gravid, appropriate for gestational age.  Pain/Pressure: Present     Pelvic: {Blank single:19197::"Cervical exam performed","Cervical exam deferred"}        Extremities: Normal range of motion.  Edema: None  Mental Status: Normal mood and affect. Normal behavior. Normal judgment and thought content.   Assessment and Plan:  Pregnancy: G3P0020 at 7840w3d  1. Supervision of normal first pregnancy, antepartum ***  2. Low-lying placenta ***  {Blank single:19197::"Term","Preterm"} labor symptoms and general obstetric precautions including but not limited to vaginal bleeding, contractions, leaking of fluid and fetal movement were reviewed in detail with the patient. Please refer  to After Visit Summary for other counseling recommendations.  Return in about 2 weeks (around 11/02/2017).  Future Appointments  Date Time Provider Department Center  10/26/2017  8:15 AM [facility] US 4 [facility]  11/02/2017  9:30 AM Aviva SignsWilliams, Raizy Auzenne L, CNM [facility] None    Wynelle BourgeoisMarie Rejina Odle, CNM

## 2017-10-19 NOTE — Progress Notes (Signed)
   PRENATAL VISIT NOTE  Subjective:  Rita Rojas is a 27 y.o. G3P0020 at 2463w3d being seen today for ongoing prenatal care.  She is currently monitored for the following issues for this low-risk pregnancy and has Supervision of normal first pregnancy, antepartum; Antepartum bleeding, second trimester; Vaginal bleeding in pregnancy; Placenta previa in second trimester; and Missed abortion on their problem list.  Patient reports no complaints.  Contractions: Not present. Vag. Bleeding: None.  Movement: Present. Denies leaking of fluid.   The following portions of the patient's history were reviewed and updated as appropriate: allergies, current medications, past family history, past medical history, past social history, past surgical history and problem list. Problem list updated.  Objective:   Vitals:   10/19/17 0900  BP: 110/65  Pulse: 82  Weight: 64 kg    Fetal Status: Fetal Heart Rate (bpm): 135   Movement: Present     General:  Alert, oriented and cooperative. Patient is in no acute distress.  Skin: Skin is warm and dry. No rash noted.   Cardiovascular: Normal heart rate noted  Respiratory: Normal respiratory effort, no problems with respiration noted  Abdomen: Soft, gravid, appropriate for gestational age.  Pain/Pressure: Present     Pelvic: Cervical exam deferred        Extremities: Normal range of motion.  Edema: None  Mental Status: Normal mood and affect. Normal behavior. Normal judgment and thought content.   Assessment and Plan:  Pregnancy: G3P0020 at 4763w3d  1. Supervision of normal first pregnancy, antepartum     Doing well    Discussed fetal movement monitoring  2. Low-lying placenta     Has followup US ordered  Preterm labor symptoms and general obstetric precautions including but not limited to vaginal bleeding, contractions, leaking of fluid and fetal movement were reviewed in detail with the patient. Please refer to After Visit Summary for other  counseling recommendations.  Return in about 2 weeks (around 11/02/2017).  Future Appointments  Date Time Provider Department Center  10/26/2017  8:15 AM [facility] US 4 [facility]  11/02/2017  9:30 AM Aviva SignsWilliams, Josiah Nieto L, CNM [facility] None    Wynelle BourgeoisMarie Alenna Russell, CNM

## 2017-10-19 NOTE — Patient Instructions (Signed)

## 2017-10-26 ENCOUNTER — Ambulatory Visit (HOSPITAL_COMMUNITY)
Admission: RE | Admit: 2017-10-26 | Discharge: 2017-10-26 | Disposition: A | Discharge status: Home / Self Care | Payer: 59 | Source: Ambulatory Visit | Attending: Obstetrics & Gynecology | Admitting: Obstetrics & Gynecology

## 2017-10-26 ENCOUNTER — Other Ambulatory Visit (HOSPITAL_COMMUNITY): Payer: Self-pay | Admitting: Obstetrics and Gynecology

## 2017-10-26 DIAGNOSIS — Z3A30 30 weeks gestation of pregnancy: Secondary | ICD-10-CM | POA: Diagnosis not present

## 2017-10-26 DIAGNOSIS — O4440 Low lying placenta NOS or without hemorrhage, unspecified trimester: Secondary | ICD-10-CM

## 2017-10-26 DIAGNOSIS — Z362 Encounter for other antenatal screening follow-up: Secondary | ICD-10-CM

## 2017-10-26 DIAGNOSIS — O4443 Low lying placenta NOS or without hemorrhage, third trimester: Secondary | ICD-10-CM | POA: Diagnosis present

## 2017-10-26 DIAGNOSIS — O3493 Maternal care for abnormality of pelvic organ, unspecified, third trimester: Secondary | ICD-10-CM | POA: Diagnosis not present

## 2017-10-26 DIAGNOSIS — O4402 Placenta previa specified as without hemorrhage, second trimester: Secondary | ICD-10-CM

## 2017-11-02 ENCOUNTER — Encounter: Payer: Self-pay | Admitting: Advanced Practice Midwife

## 2017-11-02 ENCOUNTER — Ambulatory Visit (INDEPENDENT_AMBULATORY_CARE_PROVIDER_SITE_OTHER): Payer: 59 | Admitting: Advanced Practice Midwife

## 2017-11-02 VITALS — BP 111/72 | HR 80 | Wt 142.1 lb

## 2017-11-02 DIAGNOSIS — Z3400 Encounter for supervision of normal first pregnancy, unspecified trimester: Secondary | ICD-10-CM

## 2017-11-02 DIAGNOSIS — O4443 Low lying placenta NOS or without hemorrhage, third trimester: Secondary | ICD-10-CM

## 2017-11-02 DIAGNOSIS — O4440 Low lying placenta NOS or without hemorrhage, unspecified trimester: Secondary | ICD-10-CM

## 2017-11-02 DIAGNOSIS — Z3403 Encounter for supervision of normal first pregnancy, third trimester: Secondary | ICD-10-CM

## 2017-11-02 NOTE — Patient Instructions (Signed)

## 2017-11-03 ENCOUNTER — Encounter: Payer: Self-pay | Admitting: Advanced Practice Midwife

## 2017-11-03 NOTE — Progress Notes (Signed)
   PRENATAL VISIT NOTE  Subjective:  Rita Rojas is a 27 y.o. G3P0020 at [redacted]w[redacted]d being seen today for ongoing prenatal care.  She is currently monitored for the following issues for this low-risk pregnancy and has Supervision of normal first pregnancy, antepartum; Antepartum bleeding, second trimester; Vaginal bleeding in pregnancy; Placenta previa in second trimester; and Missed abortion on their problem list.  Patient reports no complaints.  Contractions: Not present. Vag. Bleeding: None.  Movement: Present. Denies leaking of fluid.   The following portions of the patient's history were reviewed and updated as appropriate: allergies, current medications, past family history, past medical history, past social history, past surgical history and problem list. Problem list updated.  Objective:   Vitals:   11/02/17 0933  BP: 111/72  Pulse: 80  Weight: 64.4 kg    Fetal Status: Fetal Heart Rate (bpm): 128 Fundal Height: 32 cm Movement: Present     General:  Alert, oriented and cooperative. Patient is in no acute distress.  Skin: Skin is warm and dry. No rash noted.   Cardiovascular: Normal heart rate noted  Respiratory: Normal respiratory effort, no problems with respiration noted  Abdomen: Soft, gravid, appropriate for gestational age.  Pain/Pressure: Present     Pelvic: Cervical exam deferred        Extremities: Normal range of motion.  Edema: Trace  Mental Status: Normal mood and affect. Normal behavior. Normal judgment and thought content.   Assessment and Plan:  Pregnancy: G3P0020 at [redacted]w[redacted]d  1. Supervision of normal first pregnancy, antepartum     Doing well, no contractions, leaking or bleeding.  2. Low-lying placenta     Placenta previa resolved.  It is now posterior  Preterm labor symptoms and general obstetric precautions including but not limited to vaginal bleeding, contractions, leaking of fluid and fetal movement were reviewed in detail with the patient. Please  refer to After Visit Summary for other counseling recommendations.  Return in about 2 weeks (around 11/16/2017) for [facility].  Future Appointments  Date Time Provider Department Center  11/16/2017  8:15 AM Aviva Signs, CNM [facility] None    Wynelle Bourgeois, CNM

## 2017-11-16 ENCOUNTER — Encounter: Payer: Self-pay | Admitting: Advanced Practice Midwife

## 2017-11-16 ENCOUNTER — Ambulatory Visit (INDEPENDENT_AMBULATORY_CARE_PROVIDER_SITE_OTHER): Payer: 59 | Admitting: Advanced Practice Midwife

## 2017-11-16 VITALS — BP 108/70 | HR 73 | Wt 145.0 lb

## 2017-11-16 DIAGNOSIS — Z3400 Encounter for supervision of normal first pregnancy, unspecified trimester: Secondary | ICD-10-CM

## 2017-11-16 DIAGNOSIS — Z3403 Encounter for supervision of normal first pregnancy, third trimester: Secondary | ICD-10-CM

## 2017-11-16 DIAGNOSIS — N930 Postcoital and contact bleeding: Secondary | ICD-10-CM

## 2017-11-16 MED ORDER — COMFORT FIT MATERNITY SUPP MED MISC: 1.0000 | Freq: Every day | 1 refills | Status: DC

## 2017-11-16 NOTE — Progress Notes (Signed)
Pt states that Sunday she had a little bit of bleeding after intercourse and nothing since then.

## 2017-11-17 ENCOUNTER — Encounter: Payer: Self-pay | Admitting: Advanced Practice Midwife

## 2017-11-17 NOTE — Progress Notes (Signed)
   PRENATAL VISIT NOTE  Subjective:  Rita Rojas is a 27 y.o. G3P0020 at 5056w4d being seen today for ongoing prenatal care.  She is currently monitored for the following issues for this low-risk pregnancy and has Supervision of normal first pregnancy, antepartum; Antepartum bleeding, second trimester; Vaginal bleeding in pregnancy; Placenta previa in second trimester; and Missed abortion on their problem list.  Patient reports spotting after intercourse which resolved.  Contractions: Irritability.  .  Movement: Present. Denies leaking of fluid.   The following portions of the patient's history were reviewed and updated as appropriate: allergies, current medications, past family history, past medical history, past social history, past surgical history and problem list. Problem list updated.  Objective:   Vitals:   11/16/17 0825  BP: 108/70  Pulse: 73  Weight: 65.8 kg    Fetal Status: Fetal Heart Rate (bpm): 147   Movement: Present     General:  Alert, oriented and cooperative. Patient is in no acute distress.  Skin: Skin is warm and dry. No rash noted.   Cardiovascular: Normal heart rate noted  Respiratory: Normal respiratory effort, no problems with respiration noted  Abdomen: Soft, gravid, appropriate for gestational age.  Pain/Pressure: Absent     Pelvic: Cervical exam deferred        Extremities: Normal range of motion.  Edema: Trace  Mental Status: Normal mood and affect. Normal behavior. Normal judgment and thought content.   Assessment and Plan:  Pregnancy: G3P0020 at 2656w4d                      Previa, resolved                       Post coital spotting, discussed we need to see her if it recurs. Pelvic rest for now  Preterm labor symptoms and general obstetric precautions including but not limited to vaginal bleeding, contractions, leaking of fluid and fetal movement were reviewed in detail with the patient. Please refer to After Visit Summary for other counseling  recommendations.    Future Appointments  Date Time Provider Department Center  11/30/2017  8:15 AM Aviva SignsWilliams, Roquel Burgin L, CNM [facility] None    Wynelle BourgeoisMarie Booker Bhatnagar, CNM

## 2017-11-17 NOTE — Patient Instructions (Signed)

## 2017-11-30 ENCOUNTER — Encounter: Payer: Self-pay | Admitting: Advanced Practice Midwife

## 2017-11-30 ENCOUNTER — Ambulatory Visit (INDEPENDENT_AMBULATORY_CARE_PROVIDER_SITE_OTHER): Payer: 59 | Admitting: Advanced Practice Midwife

## 2017-11-30 VITALS — BP 111/61 | HR 73 | Wt 146.1 lb

## 2017-11-30 DIAGNOSIS — Z3403 Encounter for supervision of normal first pregnancy, third trimester: Secondary | ICD-10-CM

## 2017-11-30 DIAGNOSIS — Z3400 Encounter for supervision of normal first pregnancy, unspecified trimester: Secondary | ICD-10-CM

## 2017-11-30 NOTE — Patient Instructions (Signed)

## 2017-11-30 NOTE — Progress Notes (Signed)
   PRENATAL VISIT NOTE  Subjective:  Rita Rojas is a 27 y.o. G3P0020 at [redacted]w[redacted]d being seen today for ongoing prenatal care.  She is currently monitored for the following issues for this low-risk pregnancy and has Supervision of normal first pregnancy, antepartum; Antepartum bleeding, second trimester; Vaginal bleeding in pregnancy; Placenta previa in second trimester; and Missed abortion on their problem list.  Patient reports occasional contractions.  Contractions: Irritability. Vag. Bleeding: None.  Movement: Present. Denies leaking of fluid.   The following portions of the patient's history were reviewed and updated as appropriate: allergies, current medications, past family history, past medical history, past social history, past surgical history and problem list. Problem list updated.  Objective:   Vitals:   11/30/17 0819  BP: 111/61  Pulse: 73  Weight: 66.3 kg    Fetal Status: Fetal Heart Rate (bpm): 128   Movement: Present     General:  Alert, oriented and cooperative. Patient is in no acute distress.  Skin: Skin is warm and dry. No rash noted.   Cardiovascular: Normal heart rate noted  Respiratory: Normal respiratory effort, no problems with respiration noted  Abdomen: Soft, gravid, appropriate for gestational age.  Pain/Pressure: Present     Pelvic: Cervical exam deferred        Extremities: Normal range of motion.  Edema: Trace  Mental Status: Normal mood and affect. Normal behavior. Normal judgment and thought content.   Assessment and Plan:  Pregnancy: G3P0020 at [redacted]w[redacted]d  1. Supervision of normal first pregnancy, antepartum      Feels well, occas UCs     Reviewed signs to watch for      Discussed work issues, FMLA, etc      Cultures next visit  Preterm labor symptoms and general obstetric precautions including but not limited to vaginal bleeding, contractions, leaking of fluid and fetal movement were reviewed in detail with the patient. Please refer to After  Visit Summary for other counseling recommendations.  Return in about 1 week (around 12/07/2017) for [employer].  No future appointments.  Wynelle Bourgeois, CNM

## 2017-11-30 NOTE — Progress Notes (Deleted)
   PRENATAL VISIT NOTE  Subjective:  Rita Rojas is a 27 y.o. G3P0020 at [redacted]w[redacted]d being seen today for ongoing prenatal care.  She is currently monitored for the following issues for this {Blank single:19197::"high-risk","low-risk"} pregnancy and has Supervision of normal first pregnancy, antepartum; Antepartum bleeding, second trimester; Vaginal bleeding in pregnancy; Placenta previa in second trimester; and Missed abortion on their problem list.  Patient reports {sx:14538}.  Contractions: Irritability. Vag. Bleeding: None.  Movement: Present. Denies leaking of fluid.   The following portions of the patient's history were reviewed and updated as appropriate: allergies, current medications, past family history, past medical history, past social history, past surgical history and problem list. Problem list updated.  Objective:   Vitals:   11/30/17 0819  BP: 111/61  Pulse: 73  Weight: 66.3 kg    Fetal Status: Fetal Heart Rate (bpm): 128   Movement: Present     General:  Alert, oriented and cooperative. Patient is in no acute distress.  Skin: Skin is warm and dry. No rash noted.   Cardiovascular: Normal heart rate noted  Respiratory: Normal respiratory effort, no problems with respiration noted  Abdomen: Soft, gravid, appropriate for gestational age.  Pain/Pressure: Present     Pelvic: {Blank single:19197::"Cervical exam performed","Cervical exam deferred"}        Extremities: Normal range of motion.  Edema: Trace  Mental Status: Normal mood and affect. Normal behavior. Normal judgment and thought content.   Assessment and Plan:  Pregnancy: G3P0020 at [redacted]w[redacted]d  1. Supervision of normal first pregnancy, antepartum ***  {Blank single:19197::"Term","Preterm"} labor symptoms and general obstetric precautions including but not limited to vaginal bleeding, contractions, leaking of fluid and fetal movement were reviewed in detail with the patient. Please refer to After Visit Summary  for other counseling recommendations.  Return in about 1 week (around 12/07/2017) for [employer].  No future appointments.  Wynelle Bourgeois, CNM

## 2017-12-07 ENCOUNTER — Encounter: Payer: Self-pay | Admitting: Advanced Practice Midwife

## 2017-12-07 ENCOUNTER — Ambulatory Visit (INDEPENDENT_AMBULATORY_CARE_PROVIDER_SITE_OTHER): Payer: 59 | Admitting: Advanced Practice Midwife

## 2017-12-07 ENCOUNTER — Telehealth: Payer: Self-pay

## 2017-12-07 VITALS — BP 103/62 | HR 75 | Wt 148.0 lb

## 2017-12-07 DIAGNOSIS — Z113 Encounter for screening for infections with a predominantly sexual mode of transmission: Secondary | ICD-10-CM

## 2017-12-07 DIAGNOSIS — Z23 Encounter for immunization: Secondary | ICD-10-CM | POA: Diagnosis not present

## 2017-12-07 DIAGNOSIS — Z3403 Encounter for supervision of normal first pregnancy, third trimester: Secondary | ICD-10-CM

## 2017-12-07 DIAGNOSIS — Z3400 Encounter for supervision of normal first pregnancy, unspecified trimester: Secondary | ICD-10-CM

## 2017-12-07 LAB — OB RESULTS CONSOLE GBS: GBS: NEGATIVE

## 2017-12-07 LAB — OB RESULTS CONSOLE GC/CHLAMYDIA: GC PROBE AMP, GENITAL: NEGATIVE

## 2017-12-07 NOTE — Patient Instructions (Addendum)
AREA PEDIATRIC/FAMILY PRACTICE PHYSICIANS  [facility] [address], [address] [place], [place]  [address] Phone - [phone]   Fax - [phone]  [facility] [address] [place], [place] [address] Phone - [phone]   Fax - [phone]  [facility] [address] [place], [place]  [address] Phone - [phone]   Fax - [phone]  [facility] [address] [place], [place]  [address] Phone - [phone]   Fax - [phone]  [facility] [address] [place], [place]  [address] Phone - [phone]   Fax - [phone]  [facility] [address] [place], [place]  [address] Phone - [phone]   Fax - [phone]  [facility] [address] [place], [place]  [address] Phone - [phone]   Fax - [phone]  [facility] [address] [place], [place]  [address] Phone - [phone]   Fax - [phone]  [facility] [address] [place], [place]  [address] Phone - [phone]   Fax - [phone] [facility] [address] [place], [place]  [address] Phone - [phone]   Fax - [phone]  [facility] [address] [place], [place]  [address] Phone - [phone]   Fax - [phone]  [facility] [address] [place], [place]  [address] Phone - [phone]   Fax - [phone]  [facility] [address] [place], [place]  [address] Phone - [phone]   Fax - [phone]  [facility]IL[facility] GOSRANI [address] [place], [place]  [address] Phone - [phone]  [facility] [address] [place], [place]  [address] Phone - [phone]   Fax - [phone]  [facility] [address] [place], [place]  [address] Phone - [phone]   Fax - [phone]  [facility] [address] [place], [place]  [address] Phone - [phone]   Fax - [phone]  [facility] [address] [place], [place]  [address] Phone - [phone]   Fax - [phone]   [facility] [address] [place], [place]  [address] Phone - [phone]   Fax - [phone]  [facility] [address] [place], [place]  [address] Phone - [phone]   Fax - [phone]  [facility] R[facility] [address] [place], [place]  [address] Phone - [phone]   Fax - [phone]  [facility] [address] , [place]  [address] Phone - [phone]   Fax - [phone]  [facility] [address] , [place]  [address] Phone - [phone]   Fax - [phone] [facility] [address] [place], [place]  [address] Phone - [phone]   Fax - [phone]  [facility] [address] [place], [place]  [address] Phone - [phone]   Fax - [phone]  [facility] [address] [place], [place]  [address] Phone - [phone]   Fax - [phone]  [facility] Charlene Flemming MD [address] [place]  [address] Phone [phone]  Fax [phone]   Contraception Choices Contraception, also called birth control, refers to methods or devices that prevent   pregnancy. Hormonal methods Contraceptive implant A contraceptive implant is a thin, plastic tube that contains a hormone. It is inserted into the upper part of the arm. It can remain in place for up to 3 years. Progestin-only injections Progestin-only injections are injections of progestin, a synthetic form of the hormone progesterone. They are given every 3 months by a health care  provider. Birth control pills Birth control pills are pills that contain hormones that prevent pregnancy. They must be taken once a day, preferably at the same time each day. Birth control patch The birth control patch contains hormones that prevent pregnancy. It is placed on the skin and must be changed once a week for three weeks and removed on the fourth week. A prescription is needed to use this method of contraception. Vaginal ring A vaginal ring contains hormones that prevent pregnancy. It is placed in the vagina for three weeks and removed on the fourth week. After that, the process is repeated with a new ring. A prescription is needed to use this method of contraception. Emergency contraceptive Emergency contraceptives prevent pregnancy after unprotected sex. They come in pill form and can be taken up to 5 days after sex. They work best the sooner they are taken after having sex. Most emergency contraceptives are available without a prescription. This method should not be used as your only form of birth control. Barrier methods Female condom A female condom is a thin sheath that is worn over the penis during sex. Condoms keep sperm from going inside a woman's body. They can be used with a spermicide to increase their effectiveness. They should be disposed after a single use. Female condom A female condom is a soft, loose-fitting sheath that is put into the vagina before sex. The condom keeps sperm from going inside a woman's body. They should be disposed after a single use. Diaphragm A diaphragm is a soft, dome-shaped barrier. It is inserted into the vagina before sex, along with a spermicide. The diaphragm blocks sperm from entering the uterus, and the spermicide kills sperm. A diaphragm should be left in the vagina for 6-8 hours after sex and removed within 24 hours. A diaphragm is prescribed and fitted by a health care provider. A diaphragm should be replaced every 1-2 years, after giving  birth, after gaining more than 15 lb (6.8 kg), and after pelvic surgery. Cervical cap A cervical cap is a round, soft latex or plastic cup that fits over the cervix. It is inserted into the vagina before sex, along with spermicide. It blocks sperm from entering the uterus. The cap should be left in place for 6-8 hours after sex and removed within 48 hours. A cervical cap must be prescribed and fitted by a health care provider. It should be replaced every 2 years. Sponge A sponge is a soft, circular piece of polyurethane foam with spermicide on it. The sponge helps block sperm from entering the uterus, and the spermicide kills sperm. To use it, you make it wet and then insert it into the vagina. It should be inserted before sex, left in for at least 6 hours after sex, and removed and thrown away within 30 hours. Spermicides Spermicides are chemicals that kill or block sperm from entering the cervix and uterus. They can come as a cream, jelly, suppository, foam, or tablet. A spermicide should be inserted into the vagina with an applicator at least 10-15 minutes before sex to allow time for it to work. The process must be repeated   every time you have sex. Spermicides do not require a prescription. Intrauterine contraception Intrauterine device (IUD) An IUD is a T-shaped device that is put in a woman's uterus. There are two types:  Hormone IUD.This type contains progestin, a synthetic form of the hormone progesterone. This type can stay in place for 3-5 years.  Copper IUD.This type is wrapped in copper wire. It can stay in place for 10 years.  Permanent methods of contraception Female tubal ligation In this method, a woman's fallopian tubes are sealed, tied, or blocked during surgery to prevent eggs from traveling to the uterus. Hysteroscopic sterilization In this method, a small, flexible insert is placed into each fallopian tube. The inserts cause scar tissue to form in the fallopian tubes and block  them, so sperm cannot reach an egg. The procedure takes about 3 months to be effective. Another form of birth control must be used during those 3 months. Female sterilization This is a procedure to tie off the tubes that carry sperm (vasectomy). After the procedure, the man can still ejaculate fluid (semen). Natural planning methods Natural family planning In this method, a couple does not have sex on days when the woman could become pregnant. Calendar method This means keeping track of the length of each menstrual cycle, identifying the days when pregnancy can happen, and not having sex on those days. Ovulation method In this method, a couple avoids sex during ovulation. Symptothermal method This method involves not having sex during ovulation. The woman typically checks for ovulation by watching changes in her temperature and in the consistency of cervical mucus. Post-ovulation method In this method, a couple waits to have sex until after ovulation. Summary  Contraception, also called birth control, means methods or devices that prevent pregnancy.  Hormonal methods of contraception include implants, injections, pills, patches, vaginal rings, and emergency contraceptives.  Barrier methods of contraception can include female condoms, female condoms, diaphragms, cervical caps, sponges, and spermicides.  There are two types of IUDs (intrauterine devices). An IUD can be put in a woman's uterus to prevent pregnancy for 3-5 years.  Permanent sterilization can be done through a procedure for males, females, or both.  Natural family planning methods involve not having sex on days when the woman could become pregnant. This information is not intended to replace advice given to you by your health care provider. Make sure you discuss any questions you have with your health care provider. Document Released: 02/09/2005 Document Revised: 03/14/2016 Document Reviewed: 03/14/2016 Elsevier Interactive  Patient Education  2018 [employer]  

## 2017-12-07 NOTE — Telephone Encounter (Signed)
Pt called the office asking if she is dilated. Explained to pt that per her note from her OV today, she is not dilated. Understanding was voiced.

## 2017-12-07 NOTE — Progress Notes (Signed)
   PRENATAL VISIT NOTE  Subjective:  Rita Rojas is a 27 y.o. G3P0020 at [redacted]w[redacted]d being seen today for ongoing prenatal care.  She is currently monitored for the following issues for this low-risk pregnancy and has Supervision of normal first pregnancy, antepartum; Antepartum bleeding, second trimester; Vaginal bleeding in pregnancy; and Placenta previa in second trimester on their problem list.  Patient reports occasional contractions.  Contractions: Irregular. Vag. Bleeding: None.  Movement: Present. Denies leaking of fluid.   The following portions of the patient's history were reviewed and updated as appropriate: allergies, current medications, past family history, past medical history, past social history, past surgical history and problem list. Problem list updated.  Objective:   Vitals:   12/07/17 0906  BP: 103/62  Pulse: 75  Weight: 148 lb 0.6 oz (67.2 kg)    Fetal Status: Fetal Heart Rate (bpm): 136 Fundal Height: 36 cm Movement: Present  Presentation: Vertex  General:  Alert, oriented and cooperative. Patient is in no acute distress.  Skin: Skin is warm and dry. No rash noted.   Cardiovascular: Normal heart rate noted  Respiratory: Normal respiratory effort, no problems with respiration noted  Abdomen: Soft, gravid, appropriate for gestational age.  Pain/Pressure: Present     Pelvic: Cervical exam performed Dilation: Closed Effacement (%): Thick Station: Ballotable Vtx by [place]  Extremities: Normal range of motion.  Edema: Trace  Mental Status: Normal mood and affect. Normal behavior. Normal judgment and thought content.   Assessment and Plan:  Pregnancy: G3P0020 at [redacted]w[redacted]d  1. Supervision of normal first pregnancy, antepartum  - Cervicovaginal ancillary only - Culture, beta strep (group b only) - Flu vaccine  - Declines circ - Peds list given  Term labor symptoms and general obstetric precautions including but not limited to vaginal bleeding, contractions,  leaking of fluid and fetal movement were reviewed in detail with the patient. Please refer to After Visit Summary for other counseling recommendations.  Return in about 1 week (around 12/14/2017) for ROB.  Future Appointments  Date Time Provider Department Center  12/14/2017  8:45 AM Aviva Signs, CNM [facility] None    Dorathy Kinsman, [place]

## 2017-12-08 LAB — CERVICOVAGINAL ANCILLARY ONLY
CHLAMYDIA, DNA PROBE: NEGATIVE
NEISSERIA GONORRHEA: NEGATIVE

## 2017-12-11 LAB — CULTURE, BETA STREP (GROUP B ONLY): Strep Gp B Culture: NEGATIVE

## 2017-12-14 ENCOUNTER — Ambulatory Visit (INDEPENDENT_AMBULATORY_CARE_PROVIDER_SITE_OTHER): Payer: 59 | Admitting: Advanced Practice Midwife

## 2017-12-14 ENCOUNTER — Encounter: Payer: Self-pay | Admitting: Advanced Practice Midwife

## 2017-12-14 VITALS — BP 110/74 | HR 75 | Wt 147.0 lb

## 2017-12-14 DIAGNOSIS — Z3400 Encounter for supervision of normal first pregnancy, unspecified trimester: Secondary | ICD-10-CM

## 2017-12-14 DIAGNOSIS — G44209 Tension-type headache, unspecified, not intractable: Secondary | ICD-10-CM

## 2017-12-14 MED ORDER — CYCLOBENZAPRINE HCL 5 MG PO TABS: 5.0000 mg | ORAL_TABLET | Freq: Three times a day (TID) | ORAL | 0 refills | Status: DC | PRN

## 2017-12-14 NOTE — Progress Notes (Signed)
   PRENATAL VISIT NOTE  Subjective:  Rita Rojas is a 27 y.o. G3P0020 at [redacted]w[redacted]d being seen today for ongoing prenatal care.  She is currently monitored for the following issues for this low-risk pregnancy and has Supervision of normal first pregnancy, antepartum; Antepartum bleeding, second trimester; Vaginal bleeding in pregnancy; and Placenta previa in second trimester on their problem list.  Patient reports occasional contractions.  Contractions: Irregular. Vag. Bleeding: None.  Movement: Present. Denies leaking of fluid.   The following portions of the patient's history were reviewed and updated as appropriate: allergies, current medications, past family history, past medical history, past social history, past surgical history and problem list. Problem list updated.  Objective:   Vitals:   12/14/17 0852  BP: 110/74  Pulse: 75  Weight: 66.7 kg    Fetal Status: Fetal Heart Rate (bpm): 138 Fundal Height: 37 cm Movement: Present     General:  Alert, oriented and cooperative. Patient is in no acute distress.  Skin: Skin is warm and dry. No rash noted.   Cardiovascular: Normal heart rate noted  Respiratory: Normal respiratory effort, no problems with respiration noted  Abdomen: Soft, gravid, appropriate for gestational age.  Pain/Pressure: Present     Pelvic: Cervical exam deferred        Extremities: Normal range of motion.  Edema: Trace  Mental Status: Normal mood and affect. Normal behavior. Normal judgment and thought content.   Assessment and Plan:  Pregnancy: G3P0020 at [redacted]w[redacted]d  1. Supervision of normal first pregnancy, antepartum Wants to get out of work but they won't accept disability since she is low risk Discussed issues with stopping work  2. Acute non intractable tension-type headache Rx Flexeril for use at night with Tylenol  Term labor symptoms and general obstetric precautions including but not limited to vaginal bleeding, contractions, leaking of fluid  and fetal movement were reviewed in detail with the patient. Please refer to After Visit Summary for other counseling recommendations.  Return in about 1 week (around 12/21/2017) for [employer].  Future Appointments  Date Time Provider Department Center  12/21/2017  9:00 AM Aviva Signs, CNM [facility] None    Wynelle Bourgeois, CNM

## 2017-12-14 NOTE — Patient Instructions (Signed)
Vaginal Delivery Vaginal delivery means that you will give birth by pushing your baby out of your birth canal (vagina). A team of health care providers will help you before, during, and after vaginal delivery. Birth experiences are unique for every woman and every pregnancy, and birth experiences vary depending on where you choose to give birth. What should I do to prepare for my baby's birth? Before your baby is born, it is important to talk with your health care provider about:  Your labor and delivery preferences. These may include: ? Medicines that you may be given. ? How you will manage your pain. This might include non-medical pain relief techniques or injectable pain relief such as epidural analgesia. ? How you and your baby will be monitored during labor and delivery. ? Who may be in the labor and delivery room with you. ? Your feelings about surgical delivery of your baby (cesarean delivery, or C-section) if this becomes necessary. ? Your feelings about receiving donated blood through an IV tube (blood transfusion) if this becomes necessary.  Whether you are able: ? To take pictures or videos of the birth. ? To eat during labor and delivery. ? To move around, walk, or change positions during labor and delivery.  What to expect after your baby is born, such as: ? Whether delayed umbilical cord clamping and cutting is offered. ? Who will care for your baby right after birth. ? Medicines or tests that may be recommended for your baby. ? Whether breastfeeding is supported in your hospital or birth center. ? How long you will be in the hospital or birth center.  How any medical conditions you have may affect your baby or your labor and delivery experience.  To prepare for your baby's birth, you should also:  Attend all of your health care visits before delivery (prenatal visits) as recommended by your health care provider. This is important.  Prepare your home for your baby's  arrival. Make sure that you have: ? Diapers. ? Baby clothing. ? Feeding equipment. ? Safe sleeping arrangements for you and your baby.  Install a car seat in your vehicle. Have your car seat checked by a certified car seat installer to make sure that it is installed safely.  Think about who will help you with your new baby at home for at least the first several weeks after delivery.  What can I expect when I arrive at the birth center or hospital? Once you are in labor and have been admitted into the hospital or birth center, your health care provider may:  Review your pregnancy history and any concerns you have.  Insert an IV tube into one of your veins. This is used to give you fluids and medicines.  Check your blood pressure, pulse, temperature, and heart rate (vital signs).  Check whether your bag of water (amniotic sac) has broken (ruptured).  Talk with you about your birth plan and discuss pain control options.  Monitoring Your health care provider may monitor your contractions (uterine monitoring) and your baby's heart rate (fetal monitoring). You may need to be monitored:  Often, but not continuously (intermittently).  All the time or for long periods at a time (continuously). Continuous monitoring may be needed if: ? You are taking certain medicines, such as medicine to relieve pain or make your contractions stronger. ? You have pregnancy or labor complications.  Monitoring may be done by:  Placing a special stethoscope or a handheld monitoring device on your abdomen to   check your baby's heartbeat, and feeling your abdomen for contractions. This method of monitoring does not continuously record your baby's heartbeat or your contractions.  Placing monitors on your abdomen (external monitors) to record your baby's heartbeat and the frequency and length of contractions. You may not have to wear external monitors all the time.  Placing monitors inside of your uterus  (internal monitors) to record your baby's heartbeat and the frequency, length, and strength of your contractions. ? Your health care provider may use internal monitors if he or she needs more information about the strength of your contractions or your baby's heart rate. ? Internal monitors are put in place by passing a thin, flexible wire through your vagina and into your uterus. Depending on the type of monitor, it may remain in your uterus or on your baby's head until birth. ? Your health care provider will discuss the benefits and risks of internal monitoring with you and will ask for your permission before inserting the monitors.  Telemetry. This is a type of continuous monitoring that can be done with external or internal monitors. Instead of having to stay in bed, you are able to move around during telemetry. Ask your health care provider if telemetry is an option for you.  Physical exam Your health care provider may perform a physical exam. This may include:  Checking whether your baby is positioned: ? With the head toward your vagina (head-down). This is most common. ? With the head toward the top of your uterus (head-up or breech). If your baby is in a breech position, your health care provider may try to turn your baby to a head-down position so you can deliver vaginally. If it does not seem that your baby can be born vaginally, your provider may recommend surgery to deliver your baby. In rare cases, you may be able to deliver vaginally if your baby is head-up (breech delivery). ? Lying sideways (transverse). Babies that are lying sideways cannot be delivered vaginally.  Checking your cervix to determine: ? Whether it is thinning out (effacing). ? Whether it is opening up (dilating). ? How low your baby has moved into your birth canal.  What are the three stages of labor and delivery?  Normal labor and delivery is divided into the following three stages: Stage 1  Stage 1 is the  longest stage of labor, and it can last for hours or days. Stage 1 includes: ? Early labor. This is when contractions may be irregular, or regular and mild. Generally, early labor contractions are more than 10 minutes apart. ? Active labor. This is when contractions get longer, more regular, more frequent, and more intense. ? The transition phase. This is when contractions happen very close together, are very intense, and may last longer than during any other part of labor.  Contractions generally feel mild, infrequent, and irregular at first. They get stronger, more frequent (about every 2-3 minutes), and more regular as you progress from early labor through active labor and transition.  Many women progress through stage 1 naturally, but you may need help to continue making progress. If this happens, your health care provider may talk with you about: ? Rupturing your amniotic sac if it has not ruptured yet. ? Giving you medicine to help make your contractions stronger and more frequent.  Stage 1 ends when your cervix is completely dilated to 4 inches (10 cm) and completely effaced. This happens at the end of the transition phase. Stage 2  Once   your cervix is completely effaced and dilated to 4 inches (10 cm), you may start to feel an urge to push. It is common for the body to naturally take a rest before feeling the urge to push, especially if you received an epidural or certain other pain medicines. This rest period may last for up to 1-2 hours, depending on your unique labor experience.  During stage 2, contractions are generally less painful, because pushing helps relieve contraction pain. Instead of contraction pain, you may feel stretching and burning pain, especially when the widest part of your baby's head passes through the vaginal opening (crowning).  Your health care provider will closely monitor your pushing progress and your baby's progress through the vagina during stage 2.  Your  health care provider may massage the area of skin between your vaginal opening and anus (perineum) or apply warm compresses to your perineum. This helps it stretch as the baby's head starts to crown, which can help prevent perineal tearing. ? In some cases, an incision may be made in your perineum (episiotomy) to allow the baby to pass through the vaginal opening. An episiotomy helps to make the opening of the vagina larger to allow more room for the baby to fit through.  It is very important to breathe and focus so your health care provider can control the delivery of your baby's head. Your health care provider may have you decrease the intensity of your pushing, to help prevent perineal tearing.  After delivery of your baby's head, the shoulders and the rest of the body generally deliver very quickly and without difficulty.  Once your baby is delivered, the umbilical cord may be cut right away, or this may be delayed for 1-2 minutes, depending on your baby's health. This may vary among health care providers, hospitals, and birth centers.  If you and your baby are healthy enough, your baby may be placed on your chest or abdomen to help maintain the baby's temperature and to help you bond with each other. Some mothers and babies start breastfeeding at this time. Your health care team will dry your baby and help keep your baby warm during this time.  Your baby may need immediate care if he or she: ? Showed signs of distress during labor. ? Has a medical condition. ? Was born too early (prematurely). ? Had a bowel movement before birth (meconium). ? Shows signs of difficulty transitioning from being inside the uterus to being outside of the uterus. If you are planning to breastfeed, your health care team will help you begin a feeding. Stage 3  The third stage of labor starts immediately after the birth of your baby and ends after you deliver the placenta. The placenta is an organ that develops  during pregnancy to provide oxygen and nutrients to your baby in the womb.  Delivering the placenta may require some pushing, and you may have mild contractions. Breastfeeding can stimulate contractions to help you deliver the placenta.  After the placenta is delivered, your uterus should tighten (contract) and become firm. This helps to stop bleeding in your uterus. To help your uterus contract and to control bleeding, your health care provider may: ? Give you medicine by injection, through an IV tube, by mouth, or through your rectum (rectally). ? Massage your abdomen or perform a vaginal exam to remove any blood clots that are left in your uterus. ? Empty your bladder by placing a thin, flexible tube (catheter) into your bladder. ? Encourage   you to breastfeed your baby. After labor is over, you and your baby will be monitored closely to ensure that you are both healthy until you are ready to go home. Your health care team will teach you how to care for yourself and your baby. This information is not intended to replace advice given to you by your health care provider. Make sure you discuss any questions you have with your health care provider. Document Released: 11/19/2007 Document Revised: 08/30/2015 Document Reviewed: 02/24/2015 Elsevier Interactive Patient Education  2018 [employer]  

## 2017-12-21 ENCOUNTER — Encounter: Payer: Self-pay | Admitting: Advanced Practice Midwife

## 2017-12-21 ENCOUNTER — Ambulatory Visit (INDEPENDENT_AMBULATORY_CARE_PROVIDER_SITE_OTHER): Payer: 59 | Admitting: Advanced Practice Midwife

## 2017-12-21 VITALS — BP 125/74 | HR 71 | Wt 149.0 lb

## 2017-12-21 DIAGNOSIS — Z3400 Encounter for supervision of normal first pregnancy, unspecified trimester: Secondary | ICD-10-CM

## 2017-12-21 NOTE — Progress Notes (Signed)
   PRENATAL VISIT NOTE  Subjective:  Rita Rojas is a 27 y.o. G3P0020 at [redacted]w[redacted]d being seen today for ongoing prenatal care.  She is currently monitored for the following issues for this low-risk pregnancy and has Supervision of normal first pregnancy, antepartum; Antepartum bleeding, second trimester; Vaginal bleeding in pregnancy; and Placenta previa in second trimester on their problem list.  Patient reports occasional contractions.  Contractions: Irritability. Vag. Bleeding: None.  Movement: Present. Denies leaking of fluid.   The following portions of the patient's history were reviewed and updated as appropriate: allergies, current medications, past family history, past medical history, past social history, past surgical history and problem list. Problem list updated.  Objective:   Vitals:   12/21/17 0914  BP: 125/74  Pulse: 71  Weight: 67.6 kg    Fetal Status: Fetal Heart Rate (bpm): 140 Fundal Height: 39 cm Movement: Present     General:  Alert, oriented and cooperative. Patient is in no acute distress.  Skin: Skin is warm and dry. No rash noted.   Cardiovascular: Normal heart rate noted  Respiratory: Normal respiratory effort, no problems with respiration noted  Abdomen: Soft, gravid, appropriate for gestational age.  Pain/Pressure: Present     Pelvic: Cervical exam deferred        Extremities: Normal range of motion.  Edema: Trace  Mental Status: Normal mood and affect. Normal behavior. Normal judgment and thought content.   Assessment and Plan:  Pregnancy: G3P0020 at [redacted]w[redacted]d  1. Supervision of normal first pregnancy, antepartum     Discussed labor signs.  Reviewed what contractions feel like     Encouraged to go to [facility] if she develops labor symptoms, increase in pain, fever, or other concerning symptoms.    Term labor symptoms and general obstetric precautions including but not limited to vaginal bleeding, contractions, leaking of fluid and fetal movement were  reviewed in detail with the patient. Please refer to After Visit Summary for other counseling recommendations.  Return in about 1 week (around 12/28/2017) for [employer].  Future Appointments  Date Time Provider Department Center  12/28/2017  8:15 AM Aviva Signs, CNM [facility] None    Wynelle Bourgeois, CNM

## 2017-12-21 NOTE — Patient Instructions (Signed)
Vaginal Delivery Vaginal delivery means that you will give birth by pushing your baby out of your birth canal (vagina). A team of health care providers will help you before, during, and after vaginal delivery. Birth experiences are unique for every woman and every pregnancy, and birth experiences vary depending on where you choose to give birth. What should I do to prepare for my baby's birth? Before your baby is born, it is important to talk with your health care provider about:  Your labor and delivery preferences. These may include: ? Medicines that you may be given. ? How you will manage your pain. This might include non-medical pain relief techniques or injectable pain relief such as epidural analgesia. ? How you and your baby will be monitored during labor and delivery. ? Who may be in the labor and delivery room with you. ? Your feelings about surgical delivery of your baby (cesarean delivery, or C-section) if this becomes necessary. ? Your feelings about receiving donated blood through an IV tube (blood transfusion) if this becomes necessary.  Whether you are able: ? To take pictures or videos of the birth. ? To eat during labor and delivery. ? To move around, walk, or change positions during labor and delivery.  What to expect after your baby is born, such as: ? Whether delayed umbilical cord clamping and cutting is offered. ? Who will care for your baby right after birth. ? Medicines or tests that may be recommended for your baby. ? Whether breastfeeding is supported in your hospital or birth center. ? How long you will be in the hospital or birth center.  How any medical conditions you have may affect your baby or your labor and delivery experience.  To prepare for your baby's birth, you should also:  Attend all of your health care visits before delivery (prenatal visits) as recommended by your health care provider. This is important.  Prepare your home for your baby's  arrival. Make sure that you have: ? Diapers. ? Baby clothing. ? Feeding equipment. ? Safe sleeping arrangements for you and your baby.  Install a car seat in your vehicle. Have your car seat checked by a certified car seat installer to make sure that it is installed safely.  Think about who will help you with your new baby at home for at least the first several weeks after delivery.  What can I expect when I arrive at the birth center or hospital? Once you are in labor and have been admitted into the hospital or birth center, your health care provider may:  Review your pregnancy history and any concerns you have.  Insert an IV tube into one of your veins. This is used to give you fluids and medicines.  Check your blood pressure, pulse, temperature, and heart rate (vital signs).  Check whether your bag of water (amniotic sac) has broken (ruptured).  Talk with you about your birth plan and discuss pain control options.  Monitoring Your health care provider may monitor your contractions (uterine monitoring) and your baby's heart rate (fetal monitoring). You may need to be monitored:  Often, but not continuously (intermittently).  All the time or for long periods at a time (continuously). Continuous monitoring may be needed if: ? You are taking certain medicines, such as medicine to relieve pain or make your contractions stronger. ? You have pregnancy or labor complications.  Monitoring may be done by:  Placing a special stethoscope or a handheld monitoring device on your abdomen to   check your baby's heartbeat, and feeling your abdomen for contractions. This method of monitoring does not continuously record your baby's heartbeat or your contractions.  Placing monitors on your abdomen (external monitors) to record your baby's heartbeat and the frequency and length of contractions. You may not have to wear external monitors all the time.  Placing monitors inside of your uterus  (internal monitors) to record your baby's heartbeat and the frequency, length, and strength of your contractions. ? Your health care provider may use internal monitors if he or she needs more information about the strength of your contractions or your baby's heart rate. ? Internal monitors are put in place by passing a thin, flexible wire through your vagina and into your uterus. Depending on the type of monitor, it may remain in your uterus or on your baby's head until birth. ? Your health care provider will discuss the benefits and risks of internal monitoring with you and will ask for your permission before inserting the monitors.  Telemetry. This is a type of continuous monitoring that can be done with external or internal monitors. Instead of having to stay in bed, you are able to move around during telemetry. Ask your health care provider if telemetry is an option for you.  Physical exam Your health care provider may perform a physical exam. This may include:  Checking whether your baby is positioned: ? With the head toward your vagina (head-down). This is most common. ? With the head toward the top of your uterus (head-up or breech). If your baby is in a breech position, your health care provider may try to turn your baby to a head-down position so you can deliver vaginally. If it does not seem that your baby can be born vaginally, your provider may recommend surgery to deliver your baby. In rare cases, you may be able to deliver vaginally if your baby is head-up (breech delivery). ? Lying sideways (transverse). Babies that are lying sideways cannot be delivered vaginally.  Checking your cervix to determine: ? Whether it is thinning out (effacing). ? Whether it is opening up (dilating). ? How low your baby has moved into your birth canal.  What are the three stages of labor and delivery?  Normal labor and delivery is divided into the following three stages: Stage 1  Stage 1 is the  longest stage of labor, and it can last for hours or days. Stage 1 includes: ? Early labor. This is when contractions may be irregular, or regular and mild. Generally, early labor contractions are more than 10 minutes apart. ? Active labor. This is when contractions get longer, more regular, more frequent, and more intense. ? The transition phase. This is when contractions happen very close together, are very intense, and may last longer than during any other part of labor.  Contractions generally feel mild, infrequent, and irregular at first. They get stronger, more frequent (about every 2-3 minutes), and more regular as you progress from early labor through active labor and transition.  Many women progress through stage 1 naturally, but you may need help to continue making progress. If this happens, your health care provider may talk with you about: ? Rupturing your amniotic sac if it has not ruptured yet. ? Giving you medicine to help make your contractions stronger and more frequent.  Stage 1 ends when your cervix is completely dilated to 4 inches (10 cm) and completely effaced. This happens at the end of the transition phase. Stage 2  Once   your cervix is completely effaced and dilated to 4 inches (10 cm), you may start to feel an urge to push. It is common for the body to naturally take a rest before feeling the urge to push, especially if you received an epidural or certain other pain medicines. This rest period may last for up to 1-2 hours, depending on your unique labor experience.  During stage 2, contractions are generally less painful, because pushing helps relieve contraction pain. Instead of contraction pain, you may feel stretching and burning pain, especially when the widest part of your baby's head passes through the vaginal opening (crowning).  Your health care provider will closely monitor your pushing progress and your baby's progress through the vagina during stage 2.  Your  health care provider may massage the area of skin between your vaginal opening and anus (perineum) or apply warm compresses to your perineum. This helps it stretch as the baby's head starts to crown, which can help prevent perineal tearing. ? In some cases, an incision may be made in your perineum (episiotomy) to allow the baby to pass through the vaginal opening. An episiotomy helps to make the opening of the vagina larger to allow more room for the baby to fit through.  It is very important to breathe and focus so your health care provider can control the delivery of your baby's head. Your health care provider may have you decrease the intensity of your pushing, to help prevent perineal tearing.  After delivery of your baby's head, the shoulders and the rest of the body generally deliver very quickly and without difficulty.  Once your baby is delivered, the umbilical cord may be cut right away, or this may be delayed for 1-2 minutes, depending on your baby's health. This may vary among health care providers, hospitals, and birth centers.  If you and your baby are healthy enough, your baby may be placed on your chest or abdomen to help maintain the baby's temperature and to help you bond with each other. Some mothers and babies start breastfeeding at this time. Your health care team will dry your baby and help keep your baby warm during this time.  Your baby may need immediate care if he or she: ? Showed signs of distress during labor. ? Has a medical condition. ? Was born too early (prematurely). ? Had a bowel movement before birth (meconium). ? Shows signs of difficulty transitioning from being inside the uterus to being outside of the uterus. If you are planning to breastfeed, your health care team will help you begin a feeding. Stage 3  The third stage of labor starts immediately after the birth of your baby and ends after you deliver the placenta. The placenta is an organ that develops  during pregnancy to provide oxygen and nutrients to your baby in the womb.  Delivering the placenta may require some pushing, and you may have mild contractions. Breastfeeding can stimulate contractions to help you deliver the placenta.  After the placenta is delivered, your uterus should tighten (contract) and become firm. This helps to stop bleeding in your uterus. To help your uterus contract and to control bleeding, your health care provider may: ? Give you medicine by injection, through an IV tube, by mouth, or through your rectum (rectally). ? Massage your abdomen or perform a vaginal exam to remove any blood clots that are left in your uterus. ? Empty your bladder by placing a thin, flexible tube (catheter) into your bladder. ? Encourage   you to breastfeed your baby. After labor is over, you and your baby will be monitored closely to ensure that you are both healthy until you are ready to go home. Your health care team will teach you how to care for yourself and your baby. This information is not intended to replace advice given to you by your health care provider. Make sure you discuss any questions you have with your health care provider. Document Released: 11/19/2007 Document Revised: 08/30/2015 Document Reviewed: 02/24/2015 Elsevier Interactive Patient Education  2018 [employer]  

## 2017-12-27 ENCOUNTER — Telehealth: Payer: Self-pay

## 2017-12-27 NOTE — Telephone Encounter (Signed)
Patient called stating that she is having a mucous watery discharge. Patient states that she is having cramping and some light bleeding as well. Patient states that baby is moving well but is having a lot of vaginal pressure.   Patient advised to go to Maternity Admission unit at [facility] now for evaluation. Armandina Stammer RN

## 2017-12-28 ENCOUNTER — Ambulatory Visit (INDEPENDENT_AMBULATORY_CARE_PROVIDER_SITE_OTHER): Payer: 59 | Admitting: Advanced Practice Midwife

## 2017-12-28 ENCOUNTER — Encounter: Payer: Self-pay | Admitting: Advanced Practice Midwife

## 2017-12-28 VITALS — BP 107/69 | HR 76 | Wt 149.0 lb

## 2017-12-28 DIAGNOSIS — Z3400 Encounter for supervision of normal first pregnancy, unspecified trimester: Secondary | ICD-10-CM

## 2017-12-28 NOTE — Patient Instructions (Signed)
Vaginal Delivery Vaginal delivery means that you will give birth by pushing your baby out of your birth canal (vagina). A team of health care providers will help you before, during, and after vaginal delivery. Birth experiences are unique for every woman and every pregnancy, and birth experiences vary depending on where you choose to give birth. What should I do to prepare for my baby's birth? Before your baby is born, it is important to talk with your health care provider about:  Your labor and delivery preferences. These may include: ? Medicines that you may be given. ? How you will manage your pain. This might include non-medical pain relief techniques or injectable pain relief such as epidural analgesia. ? How you and your baby will be monitored during labor and delivery. ? Who may be in the labor and delivery room with you. ? Your feelings about surgical delivery of your baby (cesarean delivery, or C-section) if this becomes necessary. ? Your feelings about receiving donated blood through an IV tube (blood transfusion) if this becomes necessary.  Whether you are able: ? To take pictures or videos of the birth. ? To eat during labor and delivery. ? To move around, walk, or change positions during labor and delivery.  What to expect after your baby is born, such as: ? Whether delayed umbilical cord clamping and cutting is offered. ? Who will care for your baby right after birth. ? Medicines or tests that may be recommended for your baby. ? Whether breastfeeding is supported in your hospital or birth center. ? How long you will be in the hospital or birth center.  How any medical conditions you have may affect your baby or your labor and delivery experience.  To prepare for your baby's birth, you should also:  Attend all of your health care visits before delivery (prenatal visits) as recommended by your health care provider. This is important.  Prepare your home for your baby's  arrival. Make sure that you have: ? Diapers. ? Baby clothing. ? Feeding equipment. ? Safe sleeping arrangements for you and your baby.  Install a car seat in your vehicle. Have your car seat checked by a certified car seat installer to make sure that it is installed safely.  Think about who will help you with your new baby at home for at least the first several weeks after delivery.  What can I expect when I arrive at the birth center or hospital? Once you are in labor and have been admitted into the hospital or birth center, your health care provider may:  Review your pregnancy history and any concerns you have.  Insert an IV tube into one of your veins. This is used to give you fluids and medicines.  Check your blood pressure, pulse, temperature, and heart rate (vital signs).  Check whether your bag of water (amniotic sac) has broken (ruptured).  Talk with you about your birth plan and discuss pain control options.  Monitoring Your health care provider may monitor your contractions (uterine monitoring) and your baby's heart rate (fetal monitoring). You may need to be monitored:  Often, but not continuously (intermittently).  All the time or for long periods at a time (continuously). Continuous monitoring may be needed if: ? You are taking certain medicines, such as medicine to relieve pain or make your contractions stronger. ? You have pregnancy or labor complications.  Monitoring may be done by:  Placing a special stethoscope or a handheld monitoring device on your abdomen to   check your baby's heartbeat, and feeling your abdomen for contractions. This method of monitoring does not continuously record your baby's heartbeat or your contractions.  Placing monitors on your abdomen (external monitors) to record your baby's heartbeat and the frequency and length of contractions. You may not have to wear external monitors all the time.  Placing monitors inside of your uterus  (internal monitors) to record your baby's heartbeat and the frequency, length, and strength of your contractions. ? Your health care provider may use internal monitors if he or she needs more information about the strength of your contractions or your baby's heart rate. ? Internal monitors are put in place by passing a thin, flexible wire through your vagina and into your uterus. Depending on the type of monitor, it may remain in your uterus or on your baby's head until birth. ? Your health care provider will discuss the benefits and risks of internal monitoring with you and will ask for your permission before inserting the monitors.  Telemetry. This is a type of continuous monitoring that can be done with external or internal monitors. Instead of having to stay in bed, you are able to move around during telemetry. Ask your health care provider if telemetry is an option for you.  Physical exam Your health care provider may perform a physical exam. This may include:  Checking whether your baby is positioned: ? With the head toward your vagina (head-down). This is most common. ? With the head toward the top of your uterus (head-up or breech). If your baby is in a breech position, your health care provider may try to turn your baby to a head-down position so you can deliver vaginally. If it does not seem that your baby can be born vaginally, your provider may recommend surgery to deliver your baby. In rare cases, you may be able to deliver vaginally if your baby is head-up (breech delivery). ? Lying sideways (transverse). Babies that are lying sideways cannot be delivered vaginally.  Checking your cervix to determine: ? Whether it is thinning out (effacing). ? Whether it is opening up (dilating). ? How low your baby has moved into your birth canal.  What are the three stages of labor and delivery?  Normal labor and delivery is divided into the following three stages: Stage 1  Stage 1 is the  longest stage of labor, and it can last for hours or days. Stage 1 includes: ? Early labor. This is when contractions may be irregular, or regular and mild. Generally, early labor contractions are more than 10 minutes apart. ? Active labor. This is when contractions get longer, more regular, more frequent, and more intense. ? The transition phase. This is when contractions happen very close together, are very intense, and may last longer than during any other part of labor.  Contractions generally feel mild, infrequent, and irregular at first. They get stronger, more frequent (about every 2-3 minutes), and more regular as you progress from early labor through active labor and transition.  Many women progress through stage 1 naturally, but you may need help to continue making progress. If this happens, your health care provider may talk with you about: ? Rupturing your amniotic sac if it has not ruptured yet. ? Giving you medicine to help make your contractions stronger and more frequent.  Stage 1 ends when your cervix is completely dilated to 4 inches (10 cm) and completely effaced. This happens at the end of the transition phase. Stage 2  Once   your cervix is completely effaced and dilated to 4 inches (10 cm), you may start to feel an urge to push. It is common for the body to naturally take a rest before feeling the urge to push, especially if you received an epidural or certain other pain medicines. This rest period may last for up to 1-2 hours, depending on your unique labor experience.  During stage 2, contractions are generally less painful, because pushing helps relieve contraction pain. Instead of contraction pain, you may feel stretching and burning pain, especially when the widest part of your baby's head passes through the vaginal opening (crowning).  Your health care provider will closely monitor your pushing progress and your baby's progress through the vagina during stage 2.  Your  health care provider may massage the area of skin between your vaginal opening and anus (perineum) or apply warm compresses to your perineum. This helps it stretch as the baby's head starts to crown, which can help prevent perineal tearing. ? In some cases, an incision may be made in your perineum (episiotomy) to allow the baby to pass through the vaginal opening. An episiotomy helps to make the opening of the vagina larger to allow more room for the baby to fit through.  It is very important to breathe and focus so your health care provider can control the delivery of your baby's head. Your health care provider may have you decrease the intensity of your pushing, to help prevent perineal tearing.  After delivery of your baby's head, the shoulders and the rest of the body generally deliver very quickly and without difficulty.  Once your baby is delivered, the umbilical cord may be cut right away, or this may be delayed for 1-2 minutes, depending on your baby's health. This may vary among health care providers, hospitals, and birth centers.  If you and your baby are healthy enough, your baby may be placed on your chest or abdomen to help maintain the baby's temperature and to help you bond with each other. Some mothers and babies start breastfeeding at this time. Your health care team will dry your baby and help keep your baby warm during this time.  Your baby may need immediate care if he or she: ? Showed signs of distress during labor. ? Has a medical condition. ? Was born too early (prematurely). ? Had a bowel movement before birth (meconium). ? Shows signs of difficulty transitioning from being inside the uterus to being outside of the uterus. If you are planning to breastfeed, your health care team will help you begin a feeding. Stage 3  The third stage of labor starts immediately after the birth of your baby and ends after you deliver the placenta. The placenta is an organ that develops  during pregnancy to provide oxygen and nutrients to your baby in the womb.  Delivering the placenta may require some pushing, and you may have mild contractions. Breastfeeding can stimulate contractions to help you deliver the placenta.  After the placenta is delivered, your uterus should tighten (contract) and become firm. This helps to stop bleeding in your uterus. To help your uterus contract and to control bleeding, your health care provider may: ? Give you medicine by injection, through an IV tube, by mouth, or through your rectum (rectally). ? Massage your abdomen or perform a vaginal exam to remove any blood clots that are left in your uterus. ? Empty your bladder by placing a thin, flexible tube (catheter) into your bladder. ? Encourage   you to breastfeed your baby. After labor is over, you and your baby will be monitored closely to ensure that you are both healthy until you are ready to go home. Your health care team will teach you how to care for yourself and your baby. This information is not intended to replace advice given to you by your health care provider. Make sure you discuss any questions you have with your health care provider. Document Released: 11/19/2007 Document Revised: 08/30/2015 Document Reviewed: 02/24/2015 Elsevier Interactive Patient Education  2018 [employer]  

## 2017-12-28 NOTE — Progress Notes (Signed)
   PRENATAL VISIT NOTE  Subjective:  Rita Rojas is a 27 y.o. G3P0020 at [redacted]w[redacted]d being seen today for ongoing prenatal care.  She is currently monitored for the following issues for this low-risk pregnancy and has Supervision of normal first pregnancy, antepartum; Antepartum bleeding, second trimester; Vaginal bleeding in pregnancy; and Placenta previa in second trimester on their problem list.  Patient reports occasional contractions.  Contractions: Irregular. Vag. Bleeding: None.  Movement: Present. Denies leaking of fluid.   The following portions of the patient's history were reviewed and updated as appropriate: allergies, current medications, past family history, past medical history, past social history, past surgical history and problem list. Problem list updated.  Objective:   Vitals:   12/28/17 0830  BP: 107/69  Pulse: 76  Weight: 67.6 kg    Fetal Status: Fetal Heart Rate (bpm): 143 Fundal Height: 39 cm Movement: Present  Presentation: Vertex  General:  Alert, oriented and cooperative. Patient is in no acute distress.  Skin: Skin is warm and dry. No rash noted.   Cardiovascular: Normal heart rate noted  Respiratory: Normal respiratory effort, no problems with respiration noted  Abdomen: Soft, gravid, appropriate for gestational age.  Pain/Pressure: Present     Pelvic: Cervical exam performed Dilation: 2 Effacement (%): 60 Station: Ballotable, -3  Extremities: Normal range of motion.  Edema: Trace  Mental Status: Normal mood and affect. Normal behavior. Normal judgment and thought content.   Assessment and Plan:  Pregnancy: G3P0020 at [redacted]w[redacted]d  1. Supervision of normal first pregnancy, antepartum Reviewed signs of labor Wants to stop work .  Discussed issues around disability,  Will work with supervisor  Term labor symptoms and general obstetric precautions including but not limited to vaginal bleeding, contractions, leaking of fluid and fetal movement were reviewed  in detail with the patient. Please refer to After Visit Summary for other counseling recommendations.  Return in about 1 week (around 01/04/2018) for [employer].  No future appointments.  Wynelle Bourgeois, CNM

## 2017-12-30 ENCOUNTER — Encounter (HOSPITAL_COMMUNITY): Payer: Self-pay | Admitting: *Deleted

## 2017-12-30 ENCOUNTER — Other Ambulatory Visit: Payer: Self-pay | Admitting: Family Medicine

## 2017-12-30 ENCOUNTER — Telehealth (HOSPITAL_COMMUNITY): Payer: Self-pay | Admitting: *Deleted

## 2017-12-30 NOTE — Telephone Encounter (Signed)
Preadmission screen  

## 2018-01-03 ENCOUNTER — Encounter (HOSPITAL_COMMUNITY): Payer: Self-pay | Admitting: *Deleted

## 2018-01-03 ENCOUNTER — Inpatient Hospital Stay (HOSPITAL_COMMUNITY)
Admission: AD | Admit: 2018-01-03 | Discharge: 2018-01-06 | DRG: 807 | Disposition: A | Discharge status: Home / Self Care | Payer: 59 | Attending: Family Medicine | Admitting: Family Medicine

## 2018-01-03 DIAGNOSIS — Z3400 Encounter for supervision of normal first pregnancy, unspecified trimester: Secondary | ICD-10-CM

## 2018-01-03 DIAGNOSIS — O4692 Antepartum hemorrhage, unspecified, second trimester: Secondary | ICD-10-CM

## 2018-01-03 DIAGNOSIS — Z3A40 40 weeks gestation of pregnancy: Secondary | ICD-10-CM

## 2018-01-03 DIAGNOSIS — O43123 Velamentous insertion of umbilical cord, third trimester: Principal | ICD-10-CM | POA: Diagnosis present

## 2018-01-03 DIAGNOSIS — O4402 Placenta previa specified as without hemorrhage, second trimester: Secondary | ICD-10-CM | POA: Diagnosis present

## 2018-01-03 LAB — CBC
HEMATOCRIT: 39.9 % (ref 36.0–46.0)
HEMOGLOBIN: 13.6 g/dL (ref 12.0–15.0)
MCH: 30.5 pg (ref 26.0–34.0)
MCHC: 34.1 g/dL (ref 30.0–36.0)
MCV: 89.5 fL (ref 80.0–100.0)
NRBC: 0 % (ref 0.0–0.2)
Platelets: 261 10*3/uL (ref 150–400)
RBC: 4.46 MIL/uL (ref 3.87–5.11)
RDW: 12.2 % (ref 11.5–15.5)
WBC: 10.1 10*3/uL (ref 4.0–10.5)

## 2018-01-03 MED ORDER — LACTATED RINGERS IV SOLN
INTRAVENOUS | Status: DC
  Administered 2018-01-03 – 2018-01-04 (×3): via INTRAVENOUS

## 2018-01-03 NOTE — [facility] Note (Signed)
Pt reports contractions every 15-20 mins. Started at 4pm and more intense now. Pt denies LOF. Reports some mucous discharge. Denies vaginal bleeding. Reports good fetal movement. Pt was 2cm last Tuesday.

## 2018-01-04 ENCOUNTER — Encounter: Payer: 59 | Admitting: Advanced Practice Midwife

## 2018-01-04 ENCOUNTER — Other Ambulatory Visit: Payer: Self-pay

## 2018-01-04 ENCOUNTER — Inpatient Hospital Stay (HOSPITAL_COMMUNITY): Admission: RE | Admit: 2018-01-04 | Payer: 59 | Source: Ambulatory Visit

## 2018-01-04 ENCOUNTER — Encounter (HOSPITAL_COMMUNITY): Payer: Self-pay

## 2018-01-04 DIAGNOSIS — Z3483 Encounter for supervision of other normal pregnancy, third trimester: Secondary | ICD-10-CM | POA: Diagnosis present

## 2018-01-04 DIAGNOSIS — O480 Post-term pregnancy: Secondary | ICD-10-CM

## 2018-01-04 DIAGNOSIS — Z3A40 40 weeks gestation of pregnancy: Secondary | ICD-10-CM

## 2018-01-04 DIAGNOSIS — O43123 Velamentous insertion of umbilical cord, third trimester: Secondary | ICD-10-CM | POA: Diagnosis present

## 2018-01-04 LAB — TYPE AND SCREEN
ABO/RH(D): A POS
Antibody Screen: NEGATIVE

## 2018-01-04 LAB — ABO/RH: ABO/RH(D): A POS

## 2018-01-04 LAB — SYPHILIS: RPR W/REFLEX TO RPR TITER AND TREPONEMAL ANTIBODIES, TRADITIONAL SCREENING AND DIAGNOSIS ALGORITHM: RPR Ser Ql: NONREACTIVE

## 2018-01-04 MED ORDER — FENTANYL CITRATE (PF) 100 MCG/2ML IJ SOLN
100.0000 ug | Freq: Once | INTRAMUSCULAR | Status: AC
  Administered 2018-01-04: 100 ug via INTRAVENOUS

## 2018-01-04 MED ORDER — OXYTOCIN 40 UNITS IN LACTATED RINGERS INFUSION - SIMPLE MED
1.0000 m[IU]/min | INTRAVENOUS | Status: DC
  Administered 2018-01-04: 2 m[IU]/min via INTRAVENOUS

## 2018-01-04 MED ORDER — VITAMIN K1 1 MG/0.5ML IJ SOLN
INTRAMUSCULAR | Status: AC
  Filled 2018-01-04: qty 0.5

## 2018-01-04 MED ORDER — WITCH HAZEL-GLYCERIN EX PADS: 1.0000 "application " | MEDICATED_PAD | CUTANEOUS | Status: DC | PRN

## 2018-01-04 MED ORDER — SOD CITRATE-CITRIC ACID 500-334 MG/5ML PO SOLN: 30.0000 mL | ORAL | Status: DC | PRN

## 2018-01-04 MED ORDER — ZOLPIDEM TARTRATE 5 MG PO TABS: 5.0000 mg | ORAL_TABLET | Freq: Every evening | ORAL | Status: DC | PRN

## 2018-01-04 MED ORDER — ACETAMINOPHEN 325 MG PO TABS: 650.0000 mg | ORAL_TABLET | ORAL | Status: DC | PRN

## 2018-01-04 MED ORDER — OXYTOCIN BOLUS FROM INFUSION
500.0000 mL | Freq: Once | INTRAVENOUS | Status: AC
  Administered 2018-01-04: 500 mL via INTRAVENOUS

## 2018-01-04 MED ORDER — TETANUS-DIPHTH-ACELL PERTUSSIS 5-2.5-18.5 LF-MCG/0.5 IM SUSP: 0.5000 mL | Freq: Once | INTRAMUSCULAR | Status: DC

## 2018-01-04 MED ORDER — FENTANYL CITRATE (PF) 100 MCG/2ML IJ SOLN
100.0000 ug | INTRAMUSCULAR | Status: DC | PRN
  Administered 2018-01-04 (×5): 100 ug via INTRAVENOUS
  Filled 2018-01-04 (×5): qty 2

## 2018-01-04 MED ORDER — SENNOSIDES-DOCUSATE SODIUM 8.6-50 MG PO TABS
2.0000 | ORAL_TABLET | ORAL | Status: DC
  Administered 2018-01-05 – 2018-01-06 (×2): 2 via ORAL
  Filled 2018-01-04 (×2): qty 2

## 2018-01-04 MED ORDER — OXYCODONE-ACETAMINOPHEN 5-325 MG PO TABS: 1.0000 | ORAL_TABLET | ORAL | Status: DC | PRN

## 2018-01-04 MED ORDER — BENZOCAINE-MENTHOL 20-0.5 % EX AERO
1.0000 "application " | INHALATION_SPRAY | CUTANEOUS | Status: DC | PRN
  Administered 2018-01-04: 1 via TOPICAL
  Filled 2018-01-04: qty 56

## 2018-01-04 MED ORDER — OXYCODONE-ACETAMINOPHEN 5-325 MG PO TABS: 2.0000 | ORAL_TABLET | ORAL | Status: DC | PRN

## 2018-01-04 MED ORDER — ONDANSETRON HCL 4 MG PO TABS: 4.0000 mg | ORAL_TABLET | ORAL | Status: DC | PRN

## 2018-01-04 MED ORDER — DIPHENHYDRAMINE HCL 25 MG PO CAPS: 25.0000 mg | ORAL_CAPSULE | Freq: Four times a day (QID) | ORAL | Status: DC | PRN

## 2018-01-04 MED ORDER — ONDANSETRON HCL 4 MG/2ML IJ SOLN: 4.0000 mg | Freq: Four times a day (QID) | INTRAMUSCULAR | Status: DC | PRN

## 2018-01-04 MED ORDER — FLEET ENEMA 7-19 GM/118ML RE ENEM: 1.0000 | ENEMA | RECTAL | Status: DC | PRN

## 2018-01-04 MED ORDER — LACTATED RINGERS IV SOLN: 500.0000 mL | INTRAVENOUS | Status: DC | PRN

## 2018-01-04 MED ORDER — SIMETHICONE 80 MG PO CHEW: 80.0000 mg | CHEWABLE_TABLET | ORAL | Status: DC | PRN

## 2018-01-04 MED ORDER — COCONUT OIL OIL
1.0000 "application " | TOPICAL_OIL | Status: DC | PRN
  Filled 2018-01-04: qty 120

## 2018-01-04 MED ORDER — TERBUTALINE SULFATE 1 MG/ML IJ SOLN
0.2500 mg | Freq: Once | INTRAMUSCULAR | Status: DC | PRN
  Filled 2018-01-04: qty 1

## 2018-01-04 MED ORDER — LIDOCAINE HCL (PF) 1 % IJ SOLN
30.0000 mL | INTRAMUSCULAR | Status: DC | PRN
  Administered 2018-01-04: 30 mL via SUBCUTANEOUS
  Filled 2018-01-04: qty 30

## 2018-01-04 MED ORDER — PRENATAL MULTIVITAMIN CH
1.0000 | ORAL_TABLET | Freq: Every day | ORAL | Status: DC
  Administered 2018-01-05: 1 via ORAL
  Filled 2018-01-04: qty 1

## 2018-01-04 MED ORDER — FENTANYL CITRATE (PF) 100 MCG/2ML IJ SOLN
INTRAMUSCULAR | Status: AC
  Filled 2018-01-04: qty 2

## 2018-01-04 MED ORDER — ONDANSETRON HCL 4 MG/2ML IJ SOLN: 4.0000 mg | INTRAMUSCULAR | Status: DC | PRN

## 2018-01-04 MED ORDER — OXYTOCIN 40 UNITS IN LACTATED RINGERS INFUSION - SIMPLE MED
2.5000 [IU]/h | INTRAVENOUS | Status: DC
  Filled 2018-01-04: qty 1000

## 2018-01-04 MED ORDER — DIBUCAINE 1 % RE OINT: 1.0000 "application " | TOPICAL_OINTMENT | RECTAL | Status: DC | PRN

## 2018-01-04 MED ORDER — IBUPROFEN 600 MG PO TABS
600.0000 mg | ORAL_TABLET | Freq: Four times a day (QID) | ORAL | Status: DC
  Administered 2018-01-04 – 2018-01-06 (×7): 600 mg via ORAL
  Filled 2018-01-04 (×7): qty 1

## 2018-01-04 NOTE — Lactation Note (Addendum)
This note was copied from a baby's chart. Lactation Consultation Note  Patient Name: Rita Rojas UEAVW'U Date: 01/04/2018 Reason for consult: Initial assessment;1st time breastfeeding;Term P1, 6 hour female infant. Per mom, did not attend any BF classes in her pregnancy. Infant had 2 stools since birth.  Does not have a breast pump at home. Per mom, wants to BF her son for one year. LC ask mom to demonstrated hand expression, mom taught back and expressed 2 ml of EBM that was given to infant on spoon. Mom latched infant on right breast using cross cradle position, infant had audible swallows, repeatedly ask mom nose to breast for deeper latch, Infant BF for 15 minutes. LC discussed I & O. Reviewed Baby & Me book's Breastfeeding Basics.  Mom will BF according hunger cues, 8 to 12 times within 24 hours including nights. Mom made aware of O/P services, breastfeeding support groups, community resources, and our phone # for post-discharge questions.   Maternal Data Formula Feeding for Exclusion: No Has patient been taught Hand Expression?: Yes(Mom hand expressed 2ml of colostrum that was spoon feed to infant.)  Feeding Feeding Type: Breast Fed  LATCH Score Latch: Grasps breast easily, tongue down, lips flanged, rhythmical sucking.  Audible Swallowing: A few with stimulation  Type of Nipple: Inverted  Comfort (Breast/Nipple): Soft / non-tender  Hold (Positioning): Assistance needed to correctly position infant at breast and maintain latch.  LATCH Score: 6  Interventions Interventions: Breast feeding basics reviewed;Assisted with latch;Skin to skin;Adjust position;Breast compression;Hand express;Position options  Lactation Tools Discussed/Used WIC Program: No(Onnterested in applying lives in [place].)   Consult Status Consult Status: Follow-up Date: 01/05/18 Follow-up type: In-patient    Danelle Earthly 01/04/2018, 8:42 PM

## 2018-01-04 NOTE — Progress Notes (Addendum)
Patient ID: Rita Rojas, female   DOB: 1990-08-24, 27 y.o.   MRN: [id number] Delivery Note Rita Rojas is a 27 y.o. female G3P0020 with IUP at [redacted]w[redacted]d admitted for SOL.  She progressed with Pitocin augmentation to complete and pushed to deliver.  Cord clamping delayed by several minutes then clamped by CNM and cut by FOB.    At 2:19 PM a viable female was delivered via Vaginal, Spontaneous (Presentation: DOA ;  ).  APGAR: 9, 9; weight  pending. Placenta status: intact, spontaneous.  Cord: 3-vessel with marginal insertion.  Cord pH: n/a   Anesthesia: Nitrous Episiotomy: None Lacerations: First degree labial (non-hemostatic) Suture Repair: 3.0 vicryl Est. Blood Loss (mL):  353  Mom to postpartum.  Baby to Couplet care / Skin to Skin.  Bernerd Limbo 01/04/2018, 2:51 PM  I was gloved and present for delivery in its entirety.  Second stage of labor progressed, baby delivered after 1 hour contractions.    Complications: none  Lacerations: first degree  EBL:  Rolm Bookbinder, CNM 8:45 PM

## 2018-01-04 NOTE — Progress Notes (Signed)
Riki Berninger is a 27 y.o. G3P0020 at [redacted]w[redacted]d admitted for active labor.  Subjective: Patient laboring in bed with FOB at bedside and nitrous for support. Large anterior cervical lip found on cervical exam, repositioned patient into hands and knees with one decel noted, with good recovery and no recurrance. Attending made aware.  Patient back to supine.  Objective: BP (!) 146/76   Pulse 88   Temp 98.5 F (36.9 C) (Oral)   Resp 16   Ht 5\' 2"  (1.575 m)   Wt 68.9 kg   LMP  (LMP Unknown)   SpO2 99%   BMI 27.80 kg/m  No intake/output data recorded. No intake/output data recorded.  FHT:  FHR: 145 bpm, variability: moderate,  accelerations:  Present,  decelerations:  Present Variable UC:   regular, every 2-4 minutes SVE:   Dilation: Lip/rim Effacement (%): 90 Station: 0, Plus 1 Exam by:: Earlene Plater, RN  Labs: Lab Results  Component Value Date   WBC 10.1 01/03/2018   HGB 13.6 01/03/2018   HCT 39.9 01/03/2018   MCV 89.5 01/03/2018   PLT 261 01/03/2018    Assessment / Plan: Augmentation of labor, progressing well    Labor: Progressing normally Preeclampsia:  no signs or symptoms of toxicity Fetal Wellbeing:  Category I Pain Control:  Labor support without medications and Nitrous Oxide I/D:  n/a Anticipated MOD:  NSVD  Bernerd Limbo 01/04/2018, 12:26 PM

## 2018-01-04 NOTE — Anesthesia Pain Management Evaluation Note (Signed)
  CRNA Pain Management Visit Note  Patient: Rita Rojas, 27 y.o., female  "Hello I am a member of the anesthesia team at [facility]. We have an anesthesia team available at all times to provide care throughout the hospital, including epidural management and anesthesia for C-section. I don't know your plan for the delivery whether it a natural birth, water birth, IV sedation, nitrous supplementation, doula or epidural, but we want to meet your pain goals."   1.Was your pain managed to your expectations on prior hospitalizations?   Yes   2.What is your expectation for pain management during this hospitalization?     IV pain meds and Nitrous Oxide  3.How can we help you reach that goal? IV pain  Record the patient's initial score and the patient's pain goal.   Pain: 9  Pain Goal: 9 [facility] wants you to be able to say your pain was always managed very well.  Caera Enwright 01/04/2018

## 2018-01-04 NOTE — Progress Notes (Signed)
LABOR PROGRESS NOTE  Rita Rojas is a 27 y.o. G3P0020 at 6742w3d  admitted for SOL  Subjective: Feeling more pain with contractions  Objective: BP 109/68   Pulse 87   Temp 98.5 F (36.9 C) (Oral)   Resp 18   Ht 5\' 2"  (1.575 m)   Wt 68.9 kg   LMP  (LMP Unknown)   SpO2 99%   BMI 27.80 kg/m  or  Vitals:   01/03/18 2145 01/04/18 0003 01/04/18 0110 01/04/18 0309  BP: 113/74 (!) 108/91 97/62 109/68  Pulse: 90 96 92 87  Resp: 18     Temp: 98.2 F (36.8 C) 98.5 F (36.9 C)    TempSrc: Oral Oral    SpO2: 99%     Weight: 68.9 kg     Height: 5\' 2"  (1.575 m)        Dilation: (P) 6.5 Effacement (%): (P) 90 Cervical Position: (P) Middle Station: (P) -2 Presentation: (P) Vertex Exam by:: (P) Dr. Aneta MinsPhillip FHT: baseline rate 120s, moderate varibility, + acel, no decel Toco: regular contractions Q3476m  Labs: Lab Results  Component Value Date   WBC 10.1 01/03/2018   HGB 13.6 01/03/2018   HCT 39.9 01/03/2018   MCV 89.5 01/03/2018   PLT 261 01/03/2018    Patient Active Problem List   Diagnosis Date Noted  . Normal labor 01/04/2018  . Antepartum bleeding, second trimester 08/02/2017  . Vaginal bleeding in pregnancy   . Placenta previa in second trimester   . Supervision of normal first pregnancy, antepartum 06/03/2017    Assessment / Plan: 27 y.o. G3P0020 at 7042w3d here for SOL  Labor: AROM. Position change Fetal Wellbeing:  Cat 1 strip Pain Control:  unmedicated Anticipated MOD:  SVD  Rita AbbotNimeka Jianni Shelden, MD OB Fellow  01/04/2018, 3:26 AM

## 2018-01-04 NOTE — H&P (Addendum)
LABOR AND DELIVERY ADMISSION HISTORY AND PHYSICAL NOTE  Rita Rojas is a 27 y.o. female G63P0020 with IUP at [redacted]w[redacted]d by early [place] presenting for SOL. Pregnancy has been uncomplicated.    She reports positive fetal movement. She denies leakage of fluid or vaginal bleeding. Pt notes that she passed her mucus plug earlier today. Feeling regular contractions.  Prenatal History/Complications: PNC at [facility] Pregnancy complications:  - Uncomplicated pregnancy - Placenta previa noted on [place] in 2nd trimester (low-lying resolved)  Past Medical History: Past Medical History:  Diagnosis Date  . Medical history non-contributory     Past Surgical History: Past Surgical History:  Procedure Laterality Date  . CHOLECYSTECTOMY      Obstetrical History: OB History    Gravida  3   Para      Term      Preterm      AB  2   Living        SAB  2   TAB      Ectopic      Multiple      Live Births              Social History: Social History   Socioeconomic History  . Marital status: Single    Spouse name: Not on file  . Number of children: Not on file  . Years of education: Not on file  . Highest education level: Not on file  Occupational History  . Not on file  Social Needs  . Financial resource strain: Not hard at all  . Food insecurity:    Worry: Never true    Inability: Never true  . Transportation needs:    Medical: No    Non-medical: Not on file  Tobacco Use  . Smoking status: Never Smoker  . Smokeless tobacco: Never Used  Substance and Sexual Activity  . Alcohol use: Not Currently    Alcohol/week: 0.0 standard drinks  . Drug use: Not Currently  . Sexual activity: Yes    Birth control/protection: None    Comment: last sex 15 Nov2019  Lifestyle  . Physical activity:    Days per week: Not on file    Minutes per session: Not on file  . Stress: Not at all  Relationships  . Social connections:    Talks on phone: Not on file    Gets together: Not on  file    Attends religious service: Not on file    Active member of club or organization: Not on file    Attends meetings of clubs or organizations: Not on file    Relationship status: Not on file  Other Topics Concern  . Not on file  Social History Narrative   Marital status: single      Children: none      Employment: Social research officer, government      Sexual activity: yes; no contraception in 2016; desires pregnancy.  Total partners = 3.  Males only.   Family History: Family History  Problem Relation Age of Onset  . Diabetes Father    Allergies: No Known Allergies  Medications Prior to Admission  Medication Sig Dispense Refill Last Dose  . Prenatal Multivit-Min-Fe-FA (PRENATAL VITAMINS) 0.8 MG tablet Take 1 tablet by mouth daily. 30 tablet 12 01/02/2018 at Unknown time  . cyclobenzaprine (FLEXERIL) 5 MG tablet Take 1 tablet (5 mg total) by mouth every 8 (eight) hours as needed for muscle spasms. (Patient not taking: Reported on 12/21/2017) 20 tablet 0 Not Taking  .  Elastic Bandages & Supports (COMFORT FIT MATERNITY SUPP MED) MISC 1 Device by Does not apply route daily. (Patient not taking: Reported on 12/28/2017) 1 each 1 Not Taking    Review of Systems  All systems reviewed and negative except as stated in HPI  Physical Exam Blood pressure (!) 108/91, pulse 96, temperature 98.5 F (36.9 C), temperature source Oral, resp. rate 18, height 5\' 2"  (1.575 m), weight 68.9 kg, SpO2 99 %. General appearance: alert, oriented, NAD Lungs: normal respiratory effort Heart: regular rate Abdomen: soft, non-tender; gravid, FH appropriate for GA Extremities: No calf swelling or tenderness Presentation: cephalic Fetal monitoring: Baseline FHR 130-140 bpm with moderate variability, +accelerations, -decelerations Uterine activity: contractions every 3-5 minutes Dilation: 4.5 Effacement (%): 90 Station: -2 Exam by:: Domenic PoliteK. WeissRN  Prenatal labs: ABO, Rh: --/--/A POS (11/11 2340) Antibody: NEG  (11/11 2340) Rubella: 2.81 (04/11 1057) RPR: Non Reactive (08/12 0951)  HBsAg: Negative (04/11 1057)  HIV: Non Reactive (08/12 0951)  GC/Chlamydia: Negative GBS: Negative (10/15 0000)  2-hr GTT: Normal Genetic screening:  Normal Anatomy US: Normal  Prenatal Transfer Tool  Maternal Diabetes: No Genetic Screening: Normal Maternal Ultrasounds/Referrals: Normal Fetal Ultrasounds or other Referrals:  None Maternal Substance Abuse:  No Significant Maternal Medications:  None Significant Maternal Lab Results: None  Results for orders placed or performed during the hospital encounter of 01/03/18 (from the past 24 hour(s))  CBC   Collection Time: 01/03/18 11:40 PM  Result Value Ref Range   WBC 10.1 4.0 - 10.5 K/uL   RBC 4.46 3.87 - 5.11 MIL/uL   Hemoglobin 13.6 12.0 - 15.0 g/dL   HCT 40.939.9 81.136.0 - 91.446.0 %   MCV 89.5 80.0 - 100.0 fL   MCH 30.5 26.0 - 34.0 pg   MCHC 34.1 30.0 - 36.0 g/dL   RDW 78.212.2 95.611.5 - 21.315.5 %   Platelets 261 150 - 400 K/uL   nRBC 0.0 0.0 - 0.2 %  Type and screen [facility]   Collection Time: 01/03/18 11:40 PM  Result Value Ref Range   ABO/RH(D) A POS    Antibody Screen NEG    Sample Expiration      01/06/2018 Performed at [facility], [address] [place], [place] [address]     Patient Active Problem List   Diagnosis Date Noted  . Normal labor 01/04/2018  . Antepartum bleeding, second trimester 08/02/2017  . Vaginal bleeding in pregnancy   . Placenta previa in second trimester   . Supervision of normal first pregnancy, antepartum 06/03/2017    Assessment: Rita Rojas is a 27 y.o. G3P0020 at 2524w3d here for SOL   #Labor: allowing patient to progress naturally currently. Could consider pitocin after next check if no change  -- Pain: Nitrous oxide +/- IV fentanyl, does not want an epidural  #FWB: EFW 1644 g (61st%-ile) on US 10/26/17, cephalic by prior exam  -- Cat 1 tracing - continue monitoring  -- EFW 1644 g  (61st%-ile) on US 10/26/17 -- GBS (-)  Boy (no circumcision)/breast/IUD  Hal NeerFrances G Jenkins 01/04/2018, 12:40 AM  Patient was interviewed and examined in conjunction with the medical student. Burman NievesJulia Abraham, MD Family Medicine Resident   OB FELLOW HISTORY AND PHYSICAL ATTESTATION  I have seen and examined this patient; I agree with above documentation in the resident's note.   Gwenevere AbbotNimeka Coy Rochford, MD OB Fellow  01/04/2018, 3:39 AM

## 2018-01-04 NOTE — Progress Notes (Signed)
Rita Rojas is a 27 y.o. G3P0020 at 6429w3d admitted for active labor.  Subjective: Patient laboring in the bed with nitrous and family at bedside.   Objective: BP (!) 129/54   Pulse 73   Temp 98.3 F (36.8 C) (Oral)   Resp 20   Ht 5\' 2"  (1.575 m)   Wt 68.9 kg   LMP  (LMP Unknown)   SpO2 99%   BMI 27.80 kg/m  No intake/output data recorded.  FHT:  FHR: 135 bpm, variability: moderate,  accelerations:  Present,  decelerations:  Absent UC:   regular, every 3 minutes SVE:   Dilation: 8 Effacement (%): 90 Station: 0, Plus 1 Exam by:: Earlene Plateravis, RN   Labs: Lab Results  Component Value Date   WBC 10.1 01/03/2018   HGB 13.6 01/03/2018   HCT 39.9 01/03/2018   MCV 89.5 01/03/2018   PLT 261 01/03/2018    Assessment / Plan: Spontaneous labor, progressing normally Encouraged patient to continue laboring on R side with peanut ball and nitrous for support. Educated FOB on labor support through transition.  Labor: Progressing normally Preeclampsia:  no signs or symptoms of toxicity Fetal Wellbeing:  Category I Pain Control:  Labor support without medications, IV pain meds and Nitrous Oxide I/D:  n/a Anticipated MOD:  NSVD  Bernerd LimboJamilla R Zackry Deines 01/04/2018, 10:27 AM

## 2018-01-05 LAB — CBC
HEMATOCRIT: 29.8 % — AB (ref 36.0–46.0)
Hemoglobin: 10.3 g/dL — ABNORMAL LOW (ref 12.0–15.0)
MCH: 30.8 pg (ref 26.0–34.0)
MCHC: 34.6 g/dL (ref 30.0–36.0)
MCV: 89.2 fL (ref 80.0–100.0)
Platelets: 220 10*3/uL (ref 150–400)
RBC: 3.34 MIL/uL — AB (ref 3.87–5.11)
RDW: 12.5 % (ref 11.5–15.5)
WBC: 15.9 10*3/uL — AB (ref 4.0–10.5)
nRBC: 0 % (ref 0.0–0.2)

## 2018-01-05 NOTE — Progress Notes (Signed)
POSTPARTUM PROGRESS NOTE  Post Partum Day 1  Subjective:  Rita Rojas is a 27 y.o. [id number] s/p SVD at 686w3d.  She reports she is doing well. No acute events overnight. She denies any problems with ambulating or po intake. She is voiding normally and has passed flatus but no BM. Denies nausea or vomiting.  Pain is well controlled.  Lochia is moderate. Denies dizziness, lightheadedness, calf pain, SOB.  Objective: Blood pressure 104/62, pulse 70, temperature 98.4 F (36.9 C), temperature source Oral, resp. rate 18, height 5\' 2"  (1.575 m), weight 68.9 kg, SpO2 100 %, unknown if currently breastfeeding.  Physical Exam:  General: alert, cooperative and no distress Chest: no respiratory distress Heart:regular rate, distal pulses intact Abdomen: soft, nontender,  Uterine Fundus: firm, appropriately tender DVT Evaluation: No calf swelling or tenderness Extremities: no LE edema Skin: warm, dry  Recent Labs    01/03/18 2340 01/05/18 0533  HGB 13.6 10.3*  HCT 39.9 29.8*    Assessment/Plan: Rita Rojas is a 27 y.o. [id number] s/p SVD at 706w3d   PPD#1 - Doing well  Routine postpartum care Contraception: IUD Feeding: Breast Circ: None Dispo: Plan for discharge tomorrow.   LOS: 1 day   Matthew SarasSean Donohue, [facility] MS3 01/05/2018, 7:22 AM  OB FELLOW POSTPARTUM PROGRESS NOTE ATTESTATION  I have seen and examined this patient and agree with above documentation in the student's note.   Gwenevere AbbotNimeka Mahamed Zalewski, MD OB Fellow  01/05/2018, 7:40 PM

## 2018-01-06 LAB — BIRTH TISSUE RECOVERY COLLECTION (PLACENTA DONATION)

## 2018-01-06 MED ORDER — IBUPROFEN 600 MG PO TABS: 600.0000 mg | ORAL_TABLET | Freq: Four times a day (QID) | ORAL | 0 refills | Status: AC

## 2018-01-06 NOTE — Discharge Instructions (Signed)

## 2018-01-06 NOTE — Discharge Summary (Addendum)
Obstetrics Discharge Summary OB/GYN Faculty Practice   Patient Name: Rita Rojas DOB: July 31, 1990 MRN: [id number]  Date of admission: 01/03/2018 Delivering MD: Rolm Bookbinder   Date of discharge: 01/06/2018  Admitting diagnosis: 40 WKS, CTX Intrauterine pregnancy: [redacted]w[redacted]d     Secondary diagnosis:   Active Problems:   Supervision of normal first pregnancy, antepartum   Placenta previa in second trimester   Normal labor  Additional problems:  . Placenta previa in 2nd trimester which resolved      Discharge diagnosis: Term Pregnancy Delivered                                            Postpartum procedures: None  Complications: None  Hospital course: Rita Rojas is a 27 y.o. [redacted]w[redacted]d who was admitted for SOL. Her pregnancy was complicated by placenta previa in the second trimester which resolved. Her labor course was unremarkable. Delivery was complicated by labial laceration requiring repair. Please see delivery/op note for additional details. Her postpartum course was uncomplicated. She was breastfeeding without difficulty. By day of discharge, she was passing flatus, urinating, eating and drinking without difficulty. Her pain was well-controlled, and she was discharged home with ibuprofen. She will follow-up in clinic in 4 weeks.   Physical exam  Vitals:   01/05/18 0450 01/05/18 1450 01/05/18 2233 01/06/18 0541  BP: 104/62 (!) 91/51 102/64 91/60  Pulse: 70 70 66 67  Resp: 18 16  18   Temp: 98.4 F (36.9 C) 98.5 F (36.9 C) 98.2 F (36.8 C) 98.4 F (36.9 C)  TempSrc: Oral Oral Oral Oral  SpO2:  98% 100%   Weight:      Height:       General: well appearing, no distress Lochia: appropriate Uterine Fundus: firm Incision: N/A DVT Evaluation: No evidence of DVT seen on physical exam. Negative Homan's sign. Labs: Lab Results  Component Value Date   WBC 15.9 (H) 01/05/2018   HGB 10.3 (L) 01/05/2018   HCT 29.8 (L) 01/05/2018   MCV 89.2 01/05/2018   PLT  220 01/05/2018   No flowsheet data found.  Discharge instructions: Per After Visit Summary and "Baby and Me Booklet"  After visit meds:  Allergies as of 01/06/2018   No Known Allergies     Medication List    TAKE these medications   ibuprofen 600 MG tablet Commonly known as:  ADVIL,MOTRIN Take 1 tablet (600 mg total) by mouth every 6 (six) hours.   Prenatal Vitamins 0.8 MG tablet Take 1 tablet by mouth daily.       Postpartum contraception: IUD Diet: Routine Diet Activity: Advance as tolerated. Pelvic rest for 6 weeks.   Outpatient follow up:4 weeks Follow-up Appt: Future Appointments  Date Time Provider Department Center  02/14/2018  9:30 AM Willodean Rosenthal, MD [facility] None   Follow-up Visit:No follow-ups on file.  Newborn Data: Live born female  Birth Weight: 7 lb 10.2 oz (3465 g) APGAR: 9, 9  Newborn Delivery   Birth date/time:  01/04/2018 14:19:00 Delivery type:  Vaginal, Spontaneous     Baby Feeding: Breast Disposition:home with mother  Burman Nieves, MD Family Medicine Resident  CNM attestation I have seen and examined this patient and agree with above documentation in the resident's note.   Rita Rojas is a 27 y.o. G2X5284 s/p SVD.   Pain is well controlled.  Plan for birth control is IUD.  Method of Feeding: breast  PE:  BP 91/60 (BP Location: Right Arm)   Pulse 67   Temp 98.4 F (36.9 C) (Oral)   Resp 18   Ht 5\' 2"  (1.575 m)   Wt 68.9 kg   LMP  (LMP Unknown)   SpO2 100%   Breastfeeding? Unknown   BMI 27.80 kg/m  Fundus firm  Recent Labs    01/03/18 2340 01/05/18 0533  HGB 13.6 10.3*  HCT 39.9 29.8*     Plan: discharge today - postpartum care discussed - f/u clinic in 4 weeks for postpartum visit   Cam HaiSHAW, Rita Rojas, CNM 9:31 AM 01/06/2018

## 2018-01-07 ENCOUNTER — Inpatient Hospital Stay (HOSPITAL_COMMUNITY): Payer: 59

## 2018-02-14 ENCOUNTER — Encounter: Payer: Self-pay | Admitting: Obstetrics & Gynecology

## 2018-02-14 ENCOUNTER — Ambulatory Visit (INDEPENDENT_AMBULATORY_CARE_PROVIDER_SITE_OTHER): Payer: 59 | Admitting: Obstetrics & Gynecology

## 2018-02-14 VITALS — BP 106/68 | HR 70 | Ht 62.0 in | Wt 132.1 lb

## 2018-02-14 DIAGNOSIS — Z30430 Encounter for insertion of intrauterine contraceptive device: Secondary | ICD-10-CM | POA: Diagnosis not present

## 2018-02-14 DIAGNOSIS — Z3202 Encounter for pregnancy test, result negative: Secondary | ICD-10-CM

## 2018-02-14 LAB — POCT URINE PREGNANCY: Preg Test, Ur: NEGATIVE

## 2018-02-14 MED ORDER — LEVONORGESTREL 20 MCG/24HR IU IUD
INTRAUTERINE_SYSTEM | Freq: Once | INTRAUTERINE | Status: AC
  Administered 2018-02-14: 1 via INTRAUTERINE

## 2018-02-14 NOTE — Progress Notes (Signed)
Subjective:     Rita Rojas is a 27 y.o. female who presents for a postpartum visit. She is 5 weeks postpartum following a spontaneous vaginal delivery. I have fully reviewed the prenatal and intrapartum course. The delivery was at 40 gestational weeks. Outcome: spontaneous vaginal delivery. Anesthesia: IV sedation. Postpartum course has been uncomplicated. Baby's course has been unremarkable. Baby is feeding by both breast and bottle - Rush BarerGerber Good start. Bleeding staining only. Bowel function is normal. Bladder function is normal. Patient is not sexually active. Contraception method is IUD. Postpartum depression screening: negative.  The following portions of the patient's history were reviewed and updated as appropriate: allergies, current medications, past family history, past medical history, past social history, past surgical history and problem list.  Review of Systems Pertinent items are noted in HPI.   Objective:   BP 106/68   Pulse 70   Ht 5\' 2"  (1.575 m)   Wt 132 lb 1.3 oz (59.9 kg)   BMI 24.16 kg/m     CONSTITUTIONAL: Well-developed, well-nourished female in no acute distress.  HENT:  Normocephalic, atraumatic EYES: Conjunctivae and EOM are normal. No scleral icterus.  NECK: Normal range of motion SKIN: Skin is warm and dry. No rash noted. Not diaphoretic.No pallor. NEUROLGIC: Alert and oriented to person, place, and time. Normal coordination.  Pelvic: no blood in vault. Perineum healing well  GYNECOLOGY CLINIC PROCEDURE NOTE  IUD Insertion Procedure Note Patient identified, informed consent performed.  Discussed risks of irregular bleeding, cramping, infection, malpositioning or misplacement of the IUD outside the uterus which may require further procedures. Time out was performed.  Urine pregnancy test negative.  Speculum placed in the vagina.  Cervix visualized.  Cleaned with Betadine x 2.  Grasped anteriorly with a single tooth tenaculum.  Uterus sounded to 7  cm.  Mirena IUD placed per manufacturer's recommendations.  Strings trimmed to 3 cm. Tenaculum was removed, good hemostasis noted.  Patient tolerated procedure well.   Patient was given post-procedure instructions.  Patient was asked to follow up in 4 weeks for IUD check.   Assessment:    5 weeks postpartum exam. Pap smear not done at today's visit.   Plan:    1. Contraception: IUD 2. Follow up in: 4 weeks or as needed.    Jacia Sickman L. Harraway-Smith, M.D., Evern CoreFACOG

## 2018-02-14 NOTE — Addendum Note (Signed)
Addended by: Lorelle GibbsWILSON, CHIQUITA L on: 02/14/2018 10:29 AM   Modules accepted: Orders

## 2018-02-14 NOTE — Patient Instructions (Signed)
Intrauterine Device Insertion, Care After    This sheet gives you information about how to care for yourself after your procedure. Your health care provider may also give you more specific instructions. If you have problems or questions, contact your health care provider.  What can I expect after the procedure?  After the procedure, it is common to have:   Cramps and pain in the abdomen.   Light bleeding (spotting) or heavier bleeding that is like your menstrual period. This may last for up to a few days.   Lower back pain.   Dizziness.   Headaches.   Nausea.  Follow these instructions at home:   Before resuming sexual activity, check to make sure that you can feel the IUD string(s). You should be able to feel the end of the string(s) below the opening of your cervix. If your IUD string is in place, you may resume sexual activity.  ? If you had a hormonal IUD inserted more than 7 days after your most recent period started, you will need to use a backup method of birth control for 7 days after IUD insertion. Ask your health care provider whether this applies to you.   Continue to check that the IUD is still in place by feeling for the string(s) after every menstrual period, or once a month.   Take over-the-counter and prescription medicines only as told by your health care provider.   Do not drive or use heavy machinery while taking prescription pain medicine.   Keep all follow-up visits as told by your health care provider. This is important.  Contact a health care provider if:   You have bleeding that is heavier or lasts longer than a normal menstrual cycle.   You have a fever.   You have cramps or abdominal pain that get worse or do not get better with medicine.   You develop abdominal pain that is new or is not in the same area of earlier cramping and pain.   You feel lightheaded or weak.   You have abnormal or bad-smelling discharge from your vagina.   You have pain during sexual  activity.   You have any of the following problems with your IUD string(s):  ? The string bothers or hurts you or your sexual partner.  ? You cannot feel the string.  ? The string has gotten longer.   You can feel the IUD in your vagina.   You think you may be pregnant, or you miss your menstrual period.   You think you may have an STI (sexually transmitted infection).  Get help right away if:   You have flu-like symptoms.   You have a fever and chills.   You can feel that your IUD has slipped out of place.  Summary   After the procedure, it is common to have cramps and pain in the abdomen. It is also common to have light bleeding (spotting) or heavier bleeding that is like your menstrual period.   Continue to check that the IUD is still in place by feeling for the string(s) after every menstrual period, or once a month.   Keep all follow-up visits as told by your health care provider. This is important.   Contact your health care provider if you have problems with your IUD string(s), such as the string getting longer or bothering you or your sexual partner.  This information is not intended to replace advice given to you by your health care provider. Make   sure you discuss any questions you have with your health care provider.  Document Released: 10/08/2010 Document Revised: 01/01/2016 Document Reviewed: 01/01/2016  Elsevier Interactive Patient Education  2019 [employer]  Levonorgestrel intrauterine device (IUD)  What is this medicine?  LEVONORGESTREL IUD (LEE voe nor jes trel) is a contraceptive (birth control) device. The device is placed inside the uterus by a healthcare professional. It is used to prevent pregnancy. This device can also be used to treat heavy bleeding that occurs during your period.  This medicine may be used for other purposes; ask your health care provider or pharmacist if you have questions.  COMMON BRAND NAME(S): Kyleena, LILETTA, Mirena, Skyla  What should I tell my health  care provider before I take this medicine?  They need to know if you have any of these conditions:  -abnormal Pap smear  -cancer of the breast, uterus, or cervix  -diabetes  -endometritis  -genital or pelvic infection now or in the past  -have more than one sexual partner or your partner has more than one partner  -heart disease  -history of an ectopic or tubal pregnancy  -immune system problems  -IUD in place  -liver disease or tumor  -problems with blood clots or take blood-thinners  -seizures  -use intravenous drugs  -uterus of unusual shape  -vaginal bleeding that has not been explained  -an unusual or allergic reaction to levonorgestrel, other hormones, silicone, or polyethylene, medicines, foods, dyes, or preservatives  -pregnant or trying to get pregnant  -breast-feeding  How should I use this medicine?  This device is placed inside the uterus by a health care professional.  Talk to your pediatrician regarding the use of this medicine in children. Special care may be needed.  Overdosage: If you think you have taken too much of this medicine contact a poison control center or emergency room at once.  NOTE: This medicine is only for you. Do not share this medicine with others.  What if I miss a dose?  This does not apply. Depending on the brand of device you have inserted, the device will need to be replaced every 3 to 5 years if you wish to continue using this type of birth control.  What may interact with this medicine?  Do not take this medicine with any of the following medications:  -amprenavir  -bosentan  -fosamprenavir  This medicine may also interact with the following medications:  -aprepitant  -armodafinil  -barbiturate medicines for inducing sleep or treating seizures  -bexarotene  -boceprevir  -griseofulvin  -medicines to treat seizures like carbamazepine, ethotoin, felbamate, oxcarbazepine, phenytoin, topiramate  -modafinil  -pioglitazone  -rifabutin  -rifampin  -rifapentine  -some medicines to  treat HIV infection like atazanavir, efavirenz, indinavir, lopinavir, nelfinavir, tipranavir, ritonavir  -St. John's wort  -warfarin  This list may not describe all possible interactions. Give your health care provider a list of all the medicines, herbs, non-prescription drugs, or dietary supplements you use. Also tell them if you smoke, drink alcohol, or use illegal drugs. Some items may interact with your medicine.  What should I watch for while using this medicine?  Visit your doctor or health care professional for regular check ups. See your doctor if you or your partner has sexual contact with others, becomes HIV positive, or gets a sexual transmitted disease.  This product does not protect you against HIV infection (AIDS) or other sexually transmitted diseases.  You can check the placement of the IUD yourself by reaching up to   the top of your vagina with clean fingers to feel the threads. Do not pull on the threads. It is a good habit to check placement after each menstrual period. Call your doctor right away if you feel more of the IUD than just the threads or if you cannot feel the threads at all.  The IUD may come out by itself. You may become pregnant if the device comes out. If you notice that the IUD has come out use a backup birth control method like condoms and call your health care provider.  Using tampons will not change the position of the IUD and are okay to use during your period.  This IUD can be safely scanned with magnetic resonance imaging (MRI) only under specific conditions. Before you have an MRI, tell your healthcare provider that you have an IUD in place, and which type of IUD you have in place.  What side effects may I notice from receiving this medicine?  Side effects that you should report to your doctor or health care professional as soon as possible:  -allergic reactions like skin rash, itching or hives, swelling of the face, lips, or tongue  -fever, flu-like symptoms  -genital  sores  -high blood pressure  -no menstrual period for 6 weeks during use  -pain, swelling, warmth in the leg  -pelvic pain or tenderness  -severe or sudden headache  -signs of pregnancy  -stomach cramping  -sudden shortness of breath  -trouble with balance, talking, or walking  -unusual vaginal bleeding, discharge  -yellowing of the eyes or skin  Side effects that usually do not require medical attention (report to your doctor or health care professional if they continue or are bothersome):  -acne  -breast pain  -change in sex drive or performance  -changes in weight  -cramping, dizziness, or faintness while the device is being inserted  -headache  -irregular menstrual bleeding within first 3 to 6 months of use  -nausea  This list may not describe all possible side effects. Call your doctor for medical advice about side effects. You may report side effects to FDA at 1-800-FDA-1088.  Where should I keep my medicine?  This does not apply.  NOTE: This sheet is a summary. It may not cover all possible information. If you have questions about this medicine, talk to your doctor, pharmacist, or health care provider.   2019 Elsevier/Gold Standard (2015-11-22 14:14:56)

## 2018-02-17 ENCOUNTER — Telehealth: Payer: Self-pay

## 2018-02-17 NOTE — Telephone Encounter (Signed)
Patient calling stating she needs a return to work note. Will come pick it up. Armandina StammerJennifer Howard RN

## 2018-03-14 ENCOUNTER — Encounter: Payer: Self-pay | Admitting: Obstetrics & Gynecology

## 2018-03-14 ENCOUNTER — Ambulatory Visit (INDEPENDENT_AMBULATORY_CARE_PROVIDER_SITE_OTHER): Payer: 59 | Admitting: Obstetrics & Gynecology

## 2018-03-14 VITALS — BP 114/69 | HR 73 | Ht 62.0 in | Wt 137.0 lb

## 2018-03-14 DIAGNOSIS — Z124 Encounter for screening for malignant neoplasm of cervix: Secondary | ICD-10-CM

## 2018-03-14 DIAGNOSIS — Z30431 Encounter for routine checking of intrauterine contraceptive device: Secondary | ICD-10-CM | POA: Diagnosis not present

## 2018-03-14 NOTE — Progress Notes (Signed)
History:  28 y.o. [id number] here today for today for IUD string check; Mirena IUD was placed  02/15/2019. No complaints about the Mirena, no concerning side effects.  The following portions of the patient's history were reviewed and updated as appropriate: allergies, current medications, past family history, past medical history, past social history, past surgical history and problem list. Last pap smear date unknown. Pt transferred during her pregnancy but. I am unable to locate PAP results in any of her records.  Will obtain today.   Review of Systems:  Pertinent items are noted in HPI.   Objective:  BP 114/69   Pulse 73   Ht 5\' 2"  (1.575 m)   Wt 137 lb (62.1 kg)   Breastfeeding No   BMI 25.06 kg/m  IUD check GU: EGBUS: no lesions Vagina: no blood in vault Cervix: no lesion; no mucopurulent d/c; strings noted 3 cm from os (initially 5cm- trimmed to 3 cm) PAP done. Routine PAP.  Assessment & Plan:  Normal IUD check. Patient to keep IUD in place for five years; can come in for removal if she desires pregnancy within the next five years. Routine preventative health maintenance measures emphasized. F/u in 1 year or sooner prn  Total face-to-face time with patient was 20 min.  Greater than 50% was spent in counseling and coordination of care with the patient.   Trenton Verne L. Harraway-Smith, M.D., [facility]

## 2018-03-14 NOTE — Progress Notes (Signed)
Patient presents for IUD string check. Armandina Stammer, RN

## 2018-03-16 LAB — CYTOLOGY - PAP: Diagnosis: NEGATIVE

## 2019-02-11 IMAGING — US US MFM OB TRANSVAGINAL
1 series · 14 of 28 positions shown · non-contrast
Comparison: none

[Series 1: us mfm ob transvaginal · 118 acquisitions, 14 frames shown]
[im 5/118]
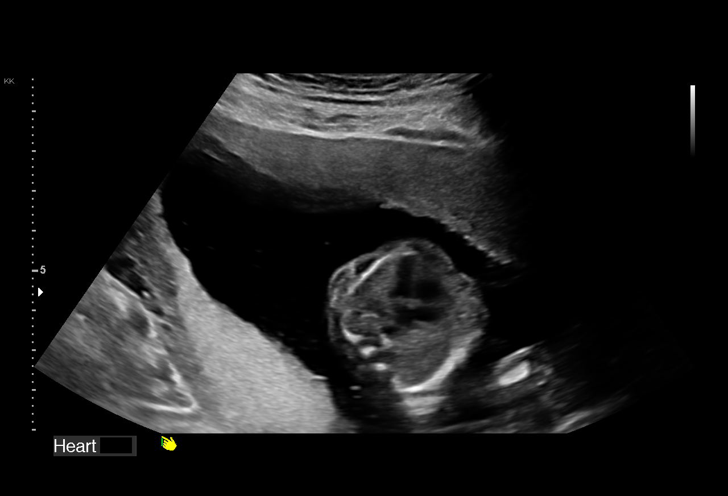
[im 14/118]
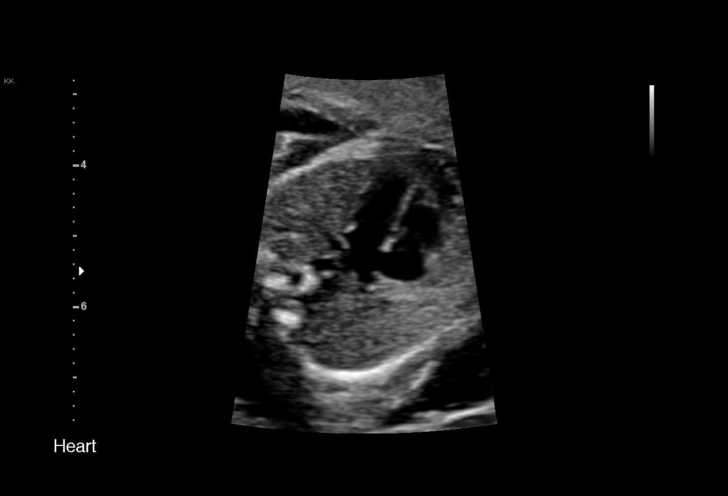
[im 22/118]
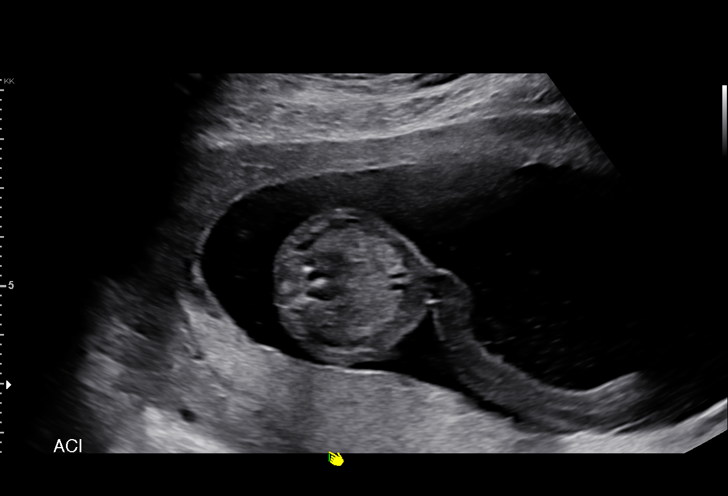
[im 31/118]
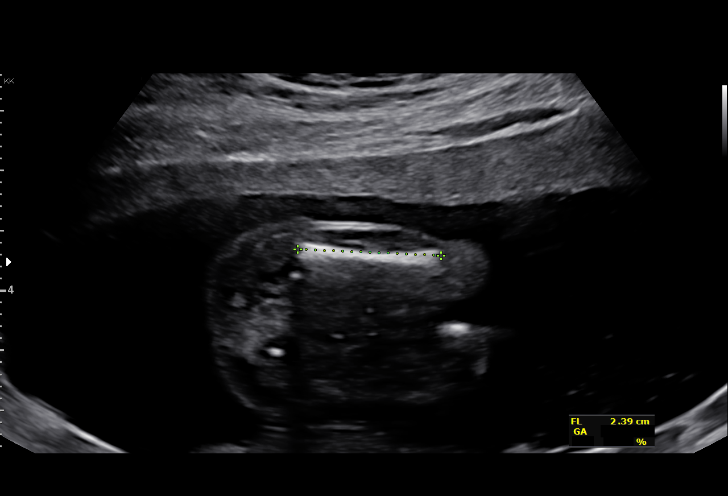
[im 40/118]
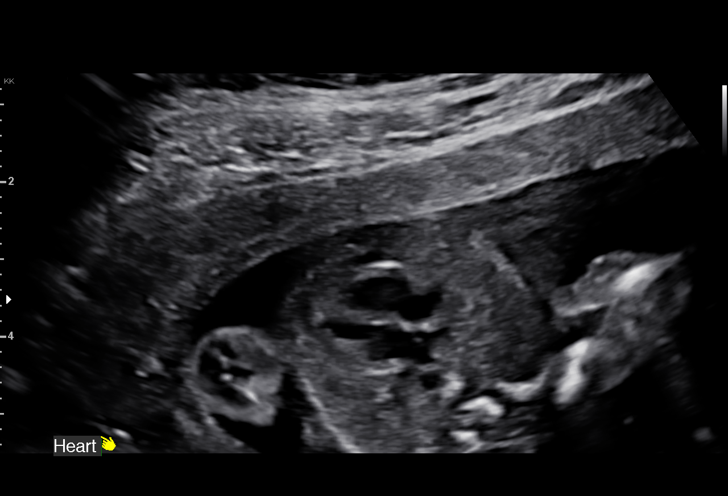
[im 48/118]
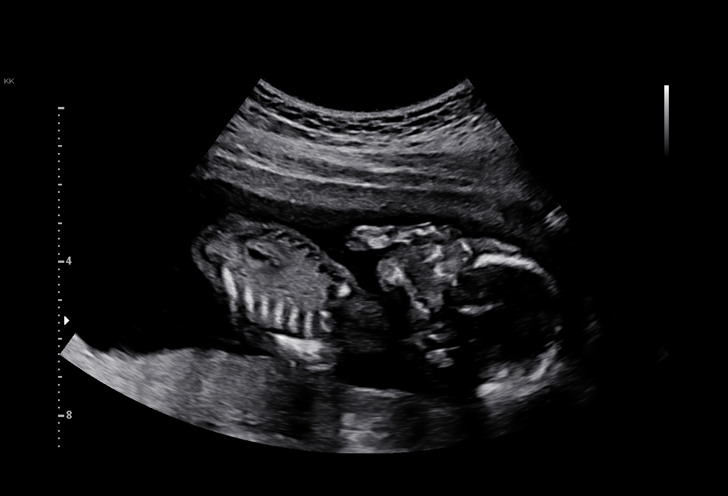
[im 57/118]
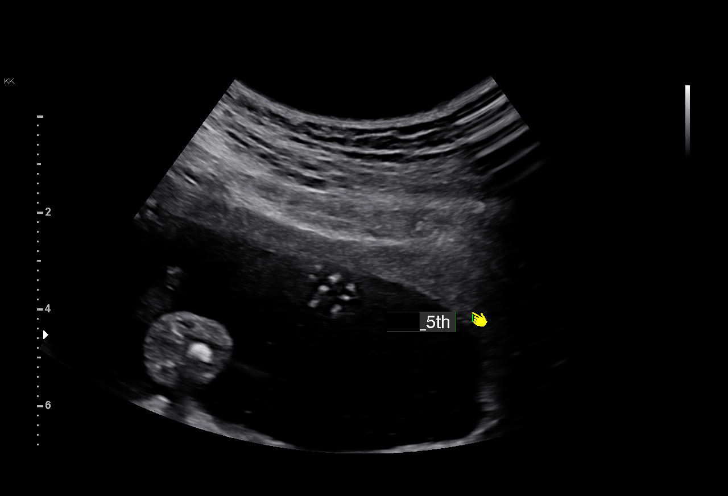
[im 66/118]
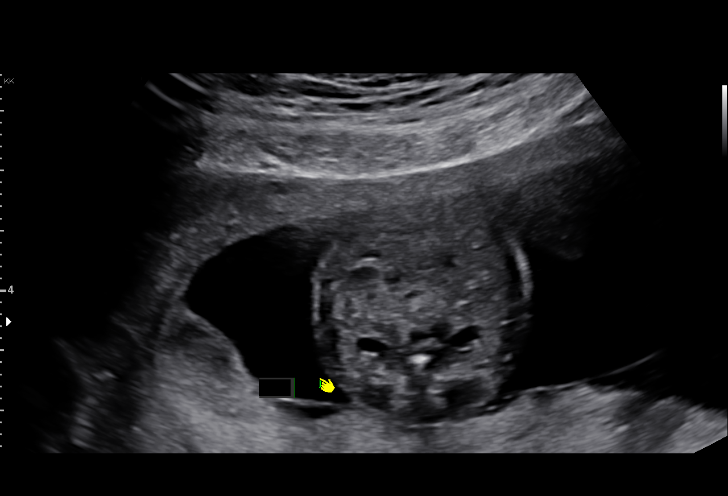
[im 74/118]
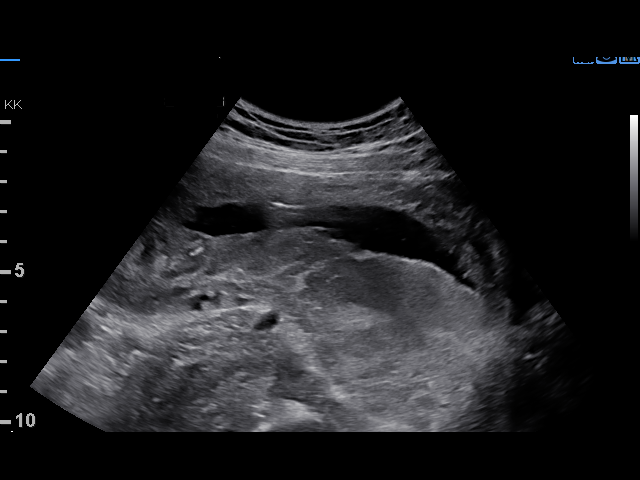
[im 83/118]
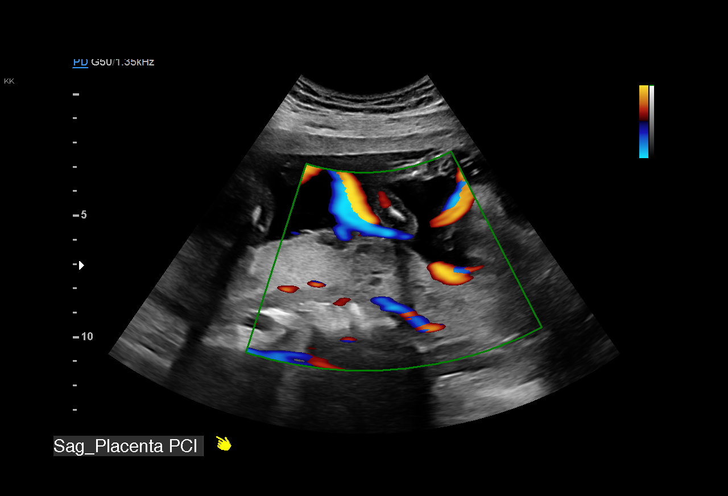
[im 92/118]
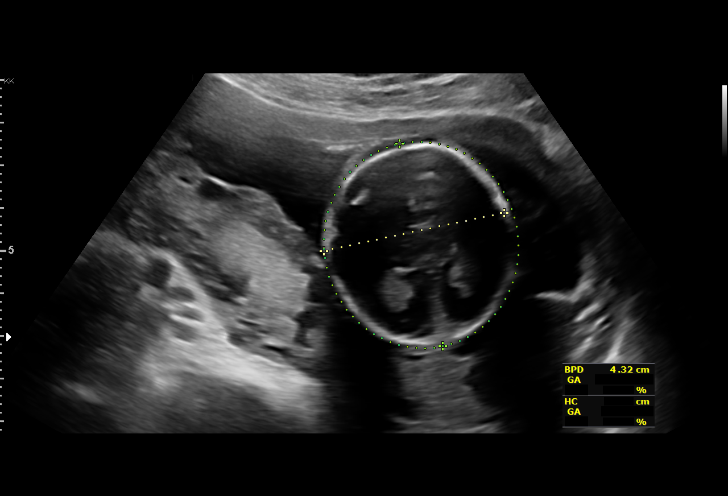
[im 100/118]
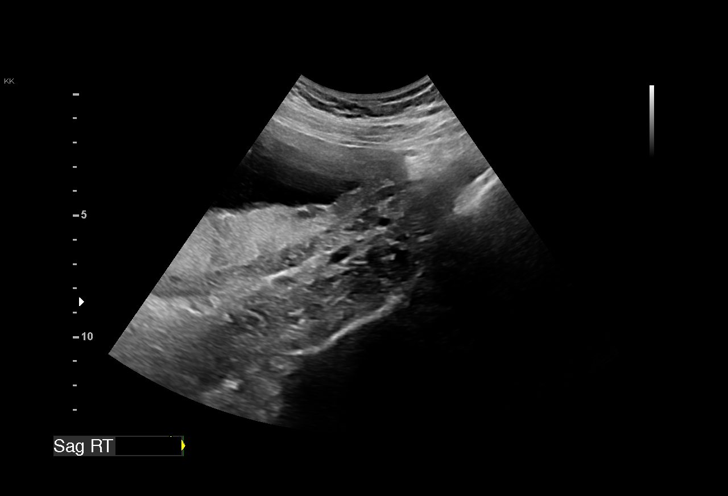
[im 109/118]
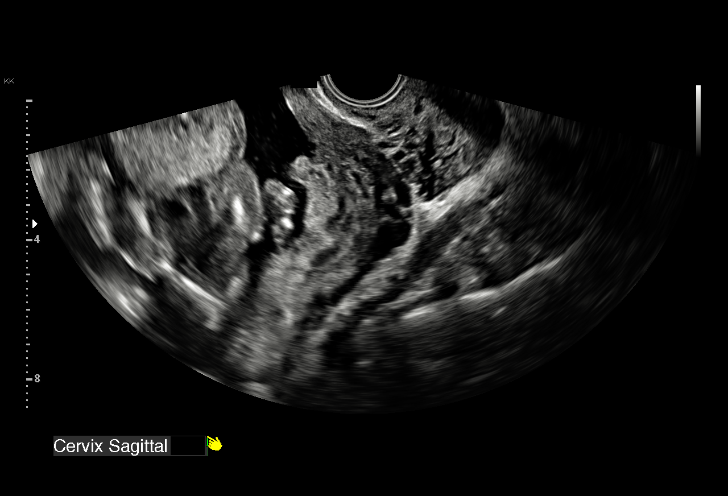
[im 118/118]
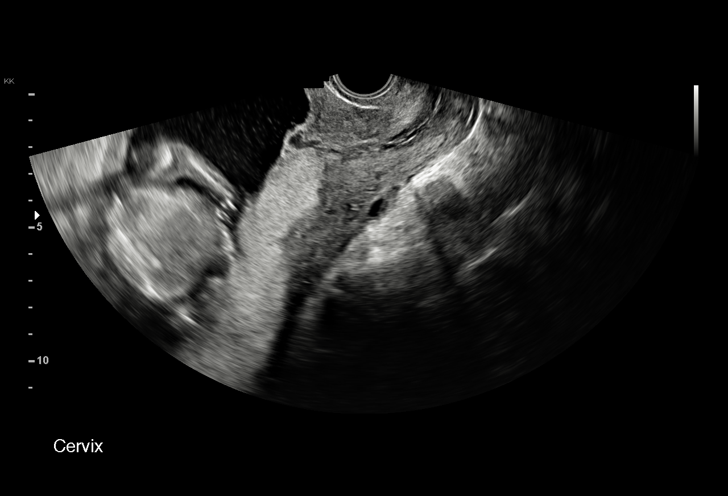

[14 of 28 positions shown; findings below may reference images not displayed]

ALTMA

DELMA

SUAZO
SUAZO
Indications

18 weeks gestation of pregnancy
Encounter for antenatal screening for
malformations
Encounter for cervical length
Vaginal bleeding in pregnancy, second
trimester

OB History

Blood Type:            Height:  5'1"   Weight (lb):  129       BMI:
Gravidity:    3         Term:   0        Prem:   0        SAB:   2
TOP:          0       Ectopic:  0        Living: 0
Fetal Evaluation

Num Of Fetuses:     1
Fetal Heart         136
Rate(bpm):
Cardiac Activity:   Observed
Presentation:       Cephalic
Placenta:           Posterior Previa
P. Cord Insertion:  Visualized
Amniotic Fluid
AFI FV:      Subjectively within normal limits

Largest Pocket(cm)
3.9
Biometry

BPD:      43.2  mm     G. Age:  19w 0d         82  %    CI:        84.81   %    70 - 86
FL/HC:      16.4   %    15.8 - 18
HC:      147.8  mm     G. Age:  17w 6d         24  %    HC/AC:      1.19        1.07 -
AC:      123.8  mm     G. Age:  18w 0d         38  %    FL/BPD:     56.0   %
FL:       24.2  mm     G. Age:  17w 2d         14  %    FL/AC:      19.5   %    20 - 24

Est. FW:     208  gm      0 lb 7 oz     36  %
Gestational Age

U/S Today:     18w 0d                                        EDD:   01/03/18
Best:          18w 2d     Det. By:  Early Ultrasound         EDD:   01/01/18
(06/03/17)
Anatomy

Cranium:               Appears normal         Aortic Arch:            Appears normal
Cavum:                 Not well visualized    Ductal Arch:            Appears normal
Ventricles:            Not well visualized    Diaphragm:              Appears normal
Choroid Plexus:        Appears normal         Stomach:                Appears normal, left
sided
Cerebellum:            Not well visualized    Abdomen:                Appears normal
Posterior Fossa:       Not well visualized    Abdominal Wall:         Appears nml (cord
insert, abd wall)
Nuchal Fold:           Not well visualized    Cord Vessels:           Appears normal (3
vessel cord)
Face:                  Appears normal         Kidneys:                Appear normal
(orbits and profile)
Lips:                  Not well visualized    Bladder:                Appears normal
Heart:                 Appears normal         Spine:                  Not well visualized
(4CH, axis, and situs
RVOT:                  Not well visualized    Upper Extremities:      Appears normal
LVOT:                  Appears normal         Lower Extremities:      Appears normal

Other:  Male gender. Heels and 5th digit visualized. Technically difficult due
to fetal position.
Cervix Uterus Adnexa

Cervix
Length:            3.7  cm.
Measured transvaginally.

Adnexa:       No abnormality visualized.
Impression

Single living intrauterine pregnancy at 18w 2d.
Placenta Posterior Previa.
Appropriate fetal growth.
Normal amniotic fluid volume.
The fetal anatomic survey is not complete.
No gross fetal anomalies identified.
The cervix measures 3.7cm transabdominally without
funneling.
The adnexa appear normal bilaterally without masses.
Recommendations

Recommend attempt to complete survey in 4 weeks.
Clinical correlation is recommended with management as
appropriate in the interim.

## 2019-05-07 IMAGING — US US MFM OB TRANSVAGINAL
1 series · 14 of 28 positions shown · non-contrast
Comparison: none

[Series 1: us mfm ob transvaginal · 14 of 81 slices shown]
[im 3/81]
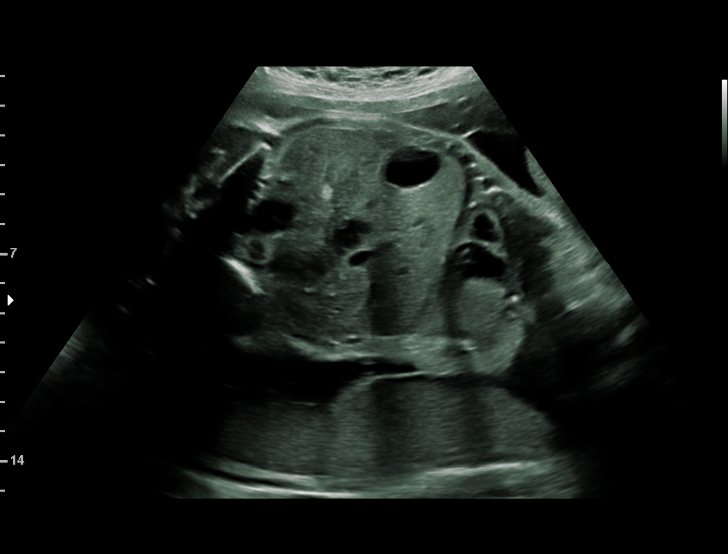
[im 9/81]
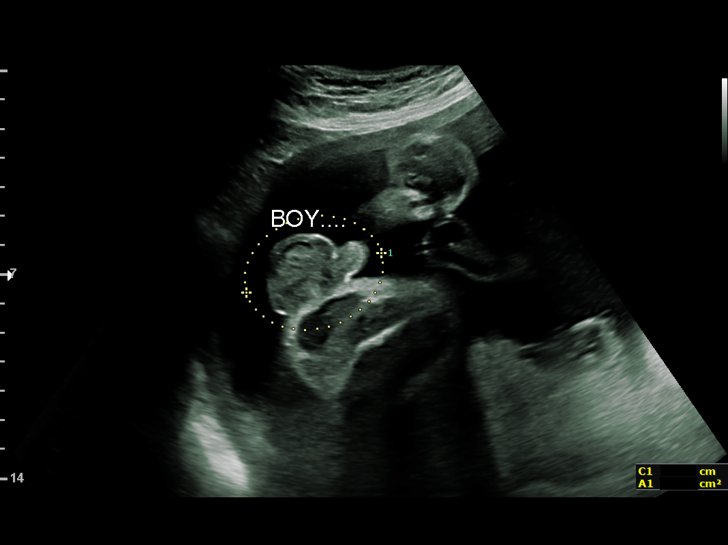
[im 15/81]
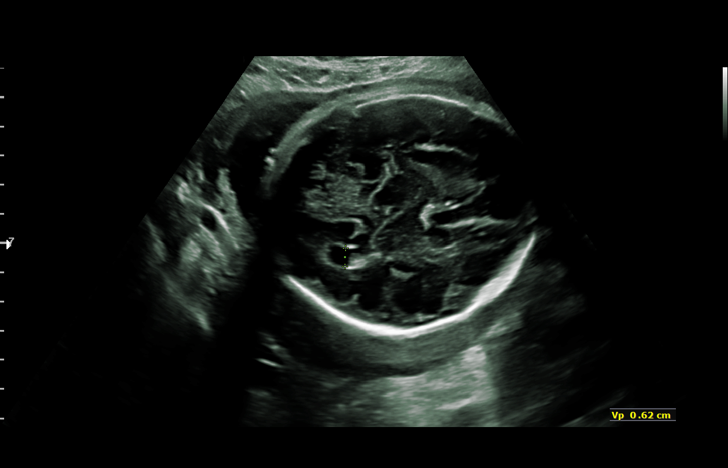
[im 21/81]
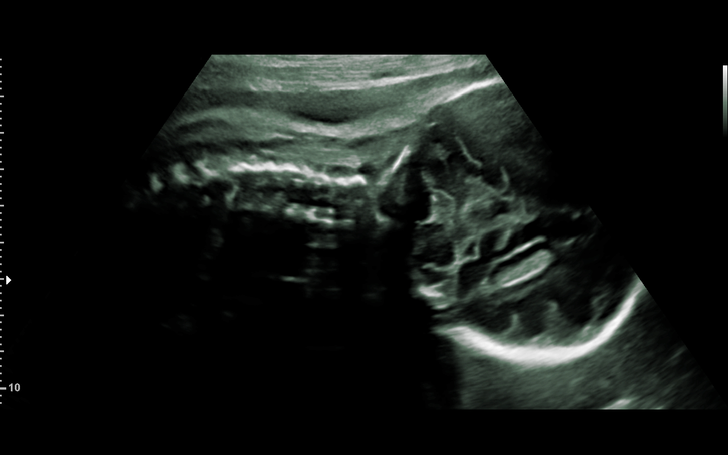
[im 27/81]
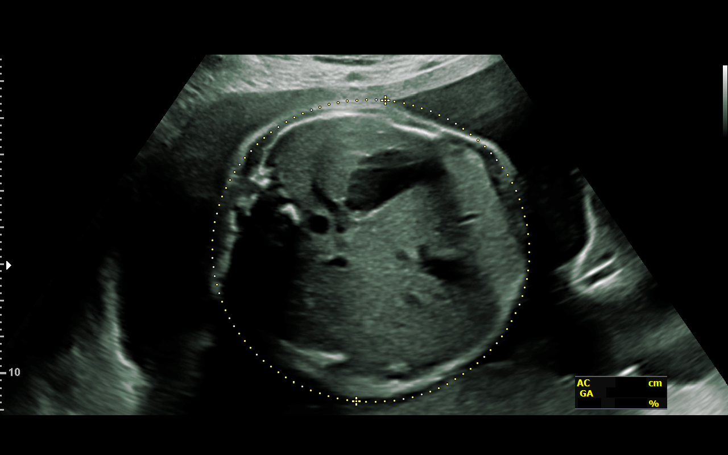
[im 33/81]
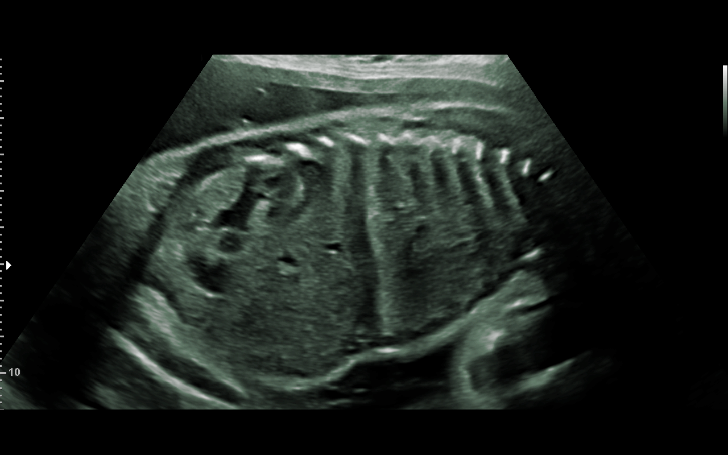
[im 39/81]
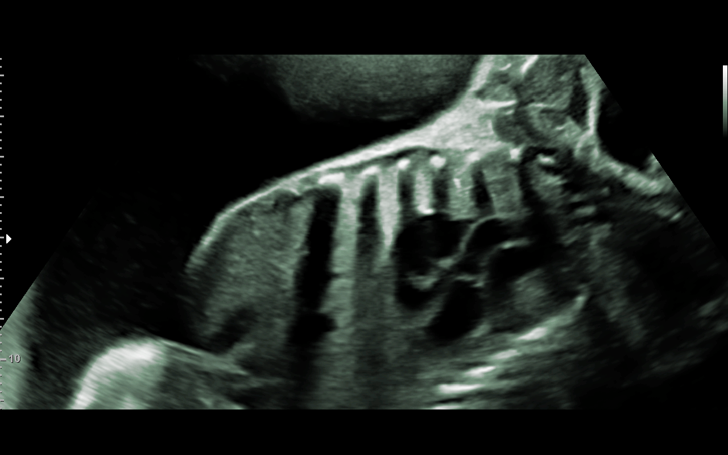
[im 45/81]
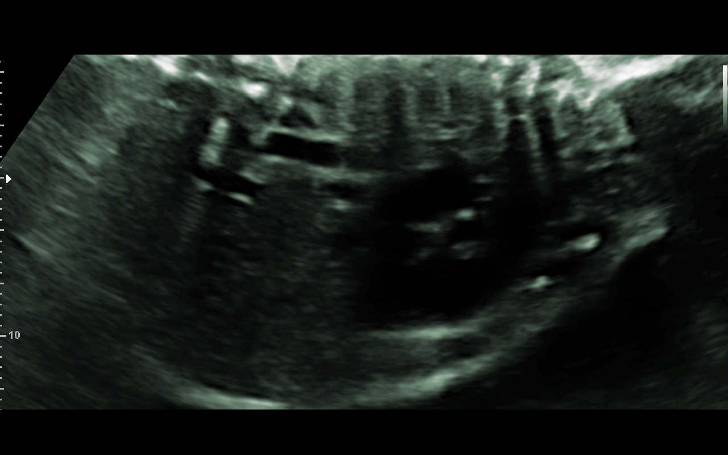
[im 51/81]
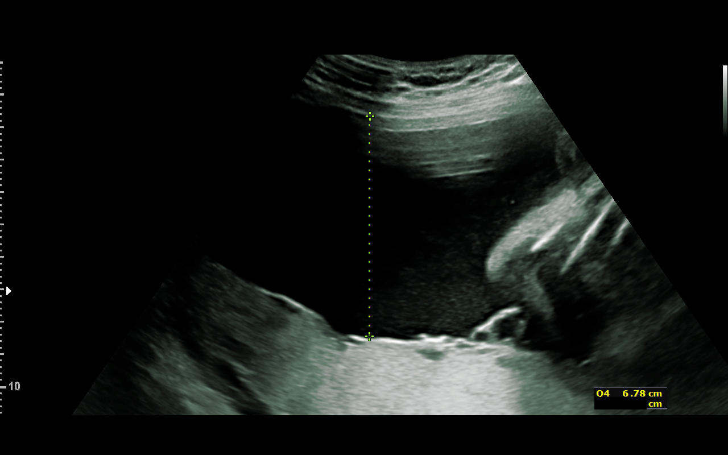
[im 57/81]
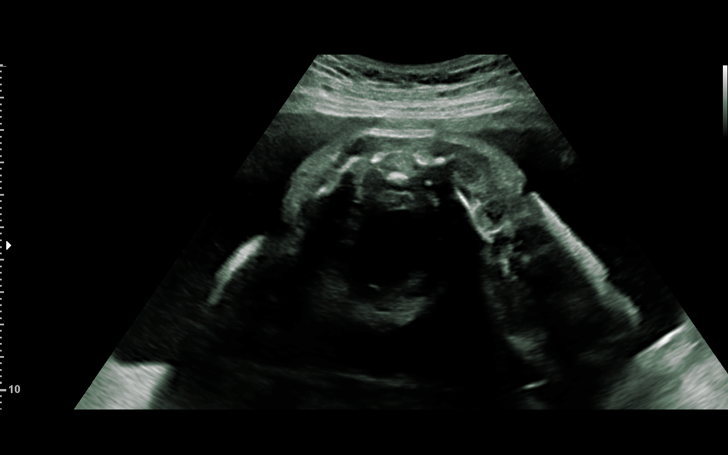
[im 63/81]
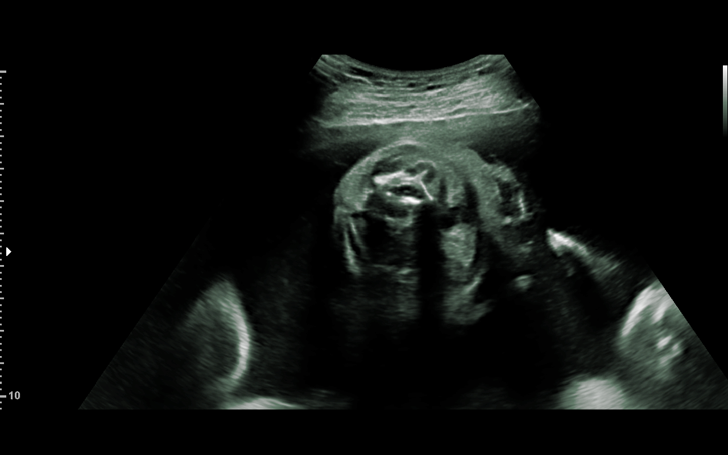
[im 69/81]
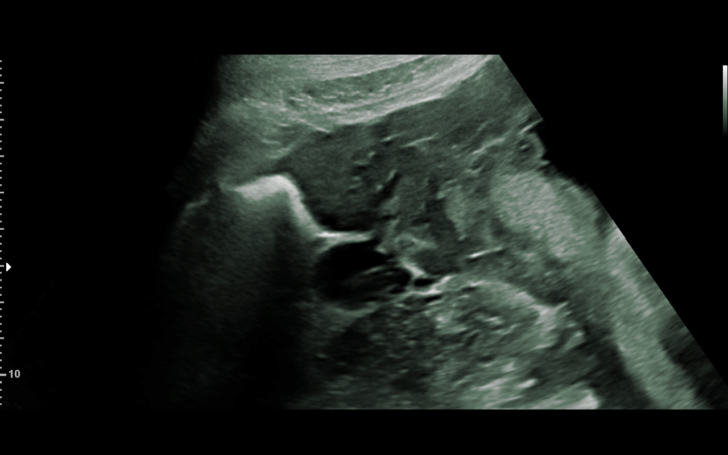
[im 75/81]
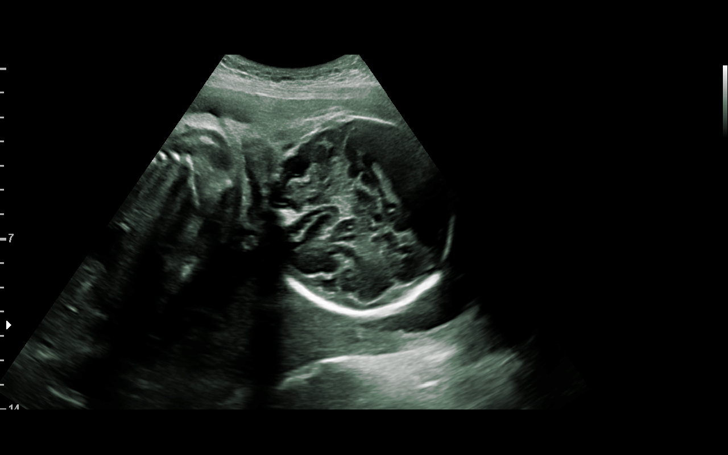
[im 81/81]
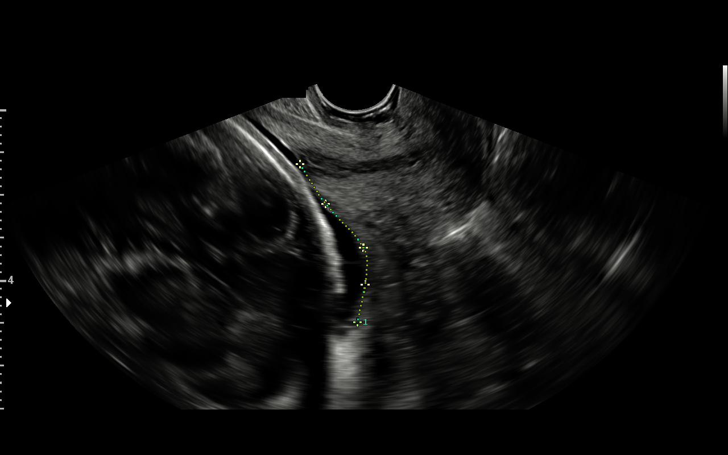

[14 of 28 positions shown; findings below may reference images not displayed]

BANKER

ZORAYDA

JATENG

Indications

30 weeks gestation of pregnancy
Encounter for other antenatal screening
follow-up
Prior scan suggested placental abnormality
in 3rd trimester(Low-lying resolved)
Vital Signs

Height:        5'1"
Fetal Evaluation

Num Of Fetuses:         1
Fetal Heart Rate(bpm):  141
Cardiac Activity:       Observed
Presentation:           Cephalic
Placenta:               Posterior
P. Cord Insertion:      Previously Visualized

Amniotic Fluid
AFI FV:      Within normal limits

AFI Sum(cm)     %Tile       Largest Pocket(cm)
17.67           66

RUQ(cm)       RLQ(cm)       LUQ(cm)        LLQ(cm)
3.69
Biometry

BPD:      79.5  mm     G. Age:  31w 6d         82  %    CI:        76.16   %    70 - 86
FL/HC:      19.6   %    19.2 -
HC:      288.7  mm     G. Age:  31w 5d         52  %    HC/AC:      1.07        0.99 -
AC:      270.6  mm     G. Age:  31w 1d         67  %    FL/BPD:     71.1   %    71 - 87
FL:       56.5  mm     G. Age:  29w 5d         18  %    FL/AC:      20.9   %    20 - 24
HUM:      52.4  mm     G. Age:  30w 4d         55  %
CER:      35.3  mm     G. Age:  30w 2d         49  %

LV:        6.2  mm

Est. FW:    1900  gm    3 lb 10 oz      61  %
OB History

Gravidity:    3         Term:   0        Prem:   0        SAB:   2
TOP:          0       Ectopic:  0        Living: 0
Gestational Age

U/S Today:     31w 1d                                        EDD:   12/27/17
Best:          30w 3d     Det. By:  Early Ultrasound         EDD:   01/01/18
(06/03/17)
Anatomy

Cranium:               Appears normal         Aortic Arch:            Previously seen
Cavum:                 Previously seen        Ductal Arch:            Appears normal
Ventricles:            Appears normal         Diaphragm:              Appears normal
Choroid Plexus:        Appears normal         Stomach:                Appears normal, left
sided
Cerebellum:            Appears normal         Abdomen:                Appears normal
Posterior Fossa:       Appears normal         Abdominal Wall:         Previously seen
Nuchal Fold:           Not applicable (>20    Cord Vessels:           Appears normal (3
wks GA)                                        vessel cord)
Face:                  Orbits and profile     Kidneys:                Appear normal
previously seen
Lips:                  Appears normal         Bladder:                Appears normal
Thoracic:              Appears normal         Spine:                  Appears normal
Heart:                 Appears normal         Upper Extremities:      Previously seen
(4CH, axis, and situs
RVOT:                  Appears normal         Lower Extremities:      Previously seen
LVOT:                  Appears normal

Other:  Male gender. Heels and 5th digit previously seen.
Cervix Uterus Adnexa

Cervix
Length:            4.1  cm.

Left Ovary
Within normal limits.

Right Ovary
Within normal limits.
Comments

U/S images reviewed. Appropriate fetal growth is noted.  No
fetal abnormalities are seen.  Low-lying placenta previa is
resolved.
Recommendations: 1) Follow-up as clinically indicated
Recommendations

Follow-up as clinically indicated

## 2021-08-19 ENCOUNTER — Ambulatory Visit: Payer: Self-pay

## 2021-08-19 ENCOUNTER — Other Ambulatory Visit: Payer: Self-pay | Admitting: Nurse Practitioner

## 2021-08-19 DIAGNOSIS — M25531 Pain in right wrist: Secondary | ICD-10-CM
